# Patient Record
Sex: Female | Born: 1970 | Race: Black or African American | Hispanic: No | Marital: Single | Smoking: Never smoker
Health system: Southern US, Community
[De-identification: ages and names within clinical notes are randomized; demographics above are authoritative.]

## PROBLEM LIST (undated history)

## (undated) DIAGNOSIS — Z853 Personal history of malignant neoplasm of breast: Secondary | ICD-10-CM

## (undated) DIAGNOSIS — I890 Lymphedema, not elsewhere classified: Secondary | ICD-10-CM

## (undated) DIAGNOSIS — L282 Other prurigo: Secondary | ICD-10-CM

## (undated) DIAGNOSIS — K219 Gastro-esophageal reflux disease without esophagitis: Secondary | ICD-10-CM

## (undated) DIAGNOSIS — N61 Mastitis without abscess: Secondary | ICD-10-CM

## (undated) DIAGNOSIS — I10 Essential (primary) hypertension: Secondary | ICD-10-CM

## (undated) DIAGNOSIS — F419 Anxiety disorder, unspecified: Secondary | ICD-10-CM

## (undated) DIAGNOSIS — Z9149 Other personal history of psychological trauma, not elsewhere classified: Secondary | ICD-10-CM

## (undated) DIAGNOSIS — E119 Type 2 diabetes mellitus without complications: Secondary | ICD-10-CM

## (undated) HISTORY — DX: Other personal history of psychological trauma, not elsewhere classified: Z91.49

## (undated) HISTORY — DX: Mastitis without abscess: N61.0

## (undated) HISTORY — DX: Gastro-esophageal reflux disease without esophagitis: K21.9

## (undated) HISTORY — DX: Type 2 diabetes mellitus without complications: E11.9

## (undated) HISTORY — DX: Personal history of malignant neoplasm of breast: Z85.3

## (undated) HISTORY — DX: Anxiety disorder, unspecified: F41.9

## (undated) HISTORY — DX: Other prurigo: L28.2

## (undated) HISTORY — DX: Lymphedema, not elsewhere classified: I89.0

---

## 2012-02-05 HISTORY — PX: KNEE SURGERY: SHX244

## 2012-02-27 ENCOUNTER — Encounter (HOSPITAL_COMMUNITY): Payer: Self-pay

## 2012-02-27 ENCOUNTER — Emergency Department (HOSPITAL_COMMUNITY)
Admission: EM | Admit: 2012-02-27 | Discharge: 2012-02-27 | Disposition: A | Discharge status: Home / Self Care | Payer: Self-pay | Attending: Emergency Medicine | Admitting: Emergency Medicine

## 2012-02-27 ENCOUNTER — Emergency Department (HOSPITAL_COMMUNITY): Payer: Self-pay

## 2012-02-27 DIAGNOSIS — R0789 Other chest pain: Secondary | ICD-10-CM | POA: Insufficient documentation

## 2012-02-27 DIAGNOSIS — E669 Obesity, unspecified: Secondary | ICD-10-CM | POA: Insufficient documentation

## 2012-02-27 DIAGNOSIS — R509 Fever, unspecified: Secondary | ICD-10-CM | POA: Insufficient documentation

## 2012-02-27 DIAGNOSIS — Z79899 Other long term (current) drug therapy: Secondary | ICD-10-CM | POA: Insufficient documentation

## 2012-02-27 DIAGNOSIS — I10 Essential (primary) hypertension: Secondary | ICD-10-CM | POA: Insufficient documentation

## 2012-02-27 DIAGNOSIS — R0602 Shortness of breath: Secondary | ICD-10-CM | POA: Insufficient documentation

## 2012-02-27 DIAGNOSIS — R51 Headache: Secondary | ICD-10-CM | POA: Insufficient documentation

## 2012-02-27 DIAGNOSIS — Z7982 Long term (current) use of aspirin: Secondary | ICD-10-CM | POA: Insufficient documentation

## 2012-02-27 DIAGNOSIS — R42 Dizziness and giddiness: Secondary | ICD-10-CM | POA: Insufficient documentation

## 2012-02-27 DIAGNOSIS — R110 Nausea: Secondary | ICD-10-CM | POA: Insufficient documentation

## 2012-02-27 HISTORY — DX: Essential (primary) hypertension: I10

## 2012-02-27 LAB — BASIC METABOLIC PANEL
BUN: 11 mg/dL (ref 6–23)
Calcium: 9.7 mg/dL (ref 8.4–10.5)
Creatinine, Ser: 0.84 mg/dL (ref 0.50–1.10)
GFR calc non Af Amer: 85 mL/min — ABNORMAL LOW (ref >90)
Glucose, Bld: 115 mg/dL — ABNORMAL HIGH (ref 70–99)

## 2012-02-27 LAB — CBC
HCT: 40.8 % (ref 36.0–46.0)
Hemoglobin: 14.2 g/dL (ref 12.0–15.0)
MCH: 27.1 pg (ref 26.0–34.0)
MCHC: 34.8 g/dL (ref 30.0–36.0)
MCV: 77.9 fL — ABNORMAL LOW (ref 78.0–100.0)

## 2012-02-27 LAB — D-DIMER, QUANTITATIVE: D-Dimer, Quant: 0.32 ug/mL-FEU (ref 0.00–0.48)

## 2012-02-27 MED ORDER — LORAZEPAM 2 MG/ML IJ SOLN
1.0000 mg | Freq: Once | INTRAMUSCULAR | Status: AC
  Administered 2012-02-27: 1 mg via INTRAVENOUS
  Filled 2012-02-27: qty 1

## 2012-02-27 NOTE — ED Notes (Signed)
Pt anxious, tearful and tachypneic, Jerri EMT at bedside consoling pt. 1mg  ativan given, pt respirations 15

## 2012-02-27 NOTE — ED Notes (Signed)
Patient presents with c/o midchest "tightness", sob, nausea x intermittently throughout the day. Endorses dizziness, headache, subjective fevers and chills. No vomiting or abdominal pain. Patient is extremely anxious at triage.

## 2012-02-27 NOTE — ED Provider Notes (Signed)
History     CSN: [id number]  Arrival date & time 02/27/12  2016   First MD Initiated Contact with Patient 02/27/12 2057      Chief Complaint  Patient presents with  . Chest Pain    (Consider location/radiation/quality/duration/timing/severity/associated sxs/prior treatment) Patient is a 42 y.o. female presenting with chest pain. The history is provided by the patient.  Chest Pain Pain location:  Epigastric Pain quality: pressure   Pain radiates to:  Does not radiate Pain radiates to the back: no   Pain severity:  Mild Onset quality:  Gradual Timing:  Intermittent Progression:  Waxing and waning Chronicity:  New Context: at rest   Relieved by:  Nothing Worsened by:  Nothing tried Ineffective treatments:  None tried Associated symptoms: shortness of breath   Associated symptoms: no abdominal pain, no back pain, no fatigue, no fever, no headache, no nausea, no palpitations, not vomiting and no weakness   Risk factors: hypertension     Past Medical History  Diagnosis Date  . Hypertension     Past Surgical History  Procedure Laterality Date  . Knee surgery Left 02/05/2012    No family history on file.  History  Substance Use Topics  . Smoking status: Never Smoker   . Smokeless tobacco: Never Used  . Alcohol Use: 10.8 oz/week    18 Cans of beer per week     Comment: about 18 cans of beer per week     OB History   Grav Para Term Preterm Abortions TAB SAB Ect Mult Living                  Review of Systems  Constitutional: Negative for fever and fatigue.  HENT: Negative for congestion, rhinorrhea and postnasal drip.   Eyes: Negative for photophobia and visual disturbance.  Respiratory: Positive for shortness of breath. Negative for chest tightness and wheezing.   Cardiovascular: Positive for chest pain. Negative for palpitations and leg swelling.  Gastrointestinal: Negative for nausea, vomiting, abdominal pain and diarrhea.  Genitourinary: Negative for  urgency, frequency and difficulty urinating.  Musculoskeletal: Negative for back pain and arthralgias.  Skin: Negative for rash and wound.  Neurological: Negative for weakness and headaches.  Psychiatric/Behavioral: Negative for confusion and agitation.    Allergies  Review of patient's allergies indicates no known allergies.  Home Medications   Current Outpatient Rx  Name  Route  Sig  Dispense  Refill  . aspirin 81 MG chewable tablet   Oral   Chew 81 mg by mouth daily.         . naproxen (NAPROSYN) 500 MG tablet   Oral   Take 500 mg by mouth 2 (two) times daily with a meal.         . oxyCODONE (OXY IR/ROXICODONE) 5 MG immediate release tablet   Oral   Take 5 mg by mouth every 4 (four) hours as needed for pain. For pain         . PRESCRIPTION MEDICATION   Oral   Take 1 tablet by mouth daily. 2 blood pressure medication         . traZODone (DESYREL) 150 MG tablet   Oral   Take 150 mg by mouth at bedtime.           BP 128/88  Pulse 85  Temp(Src) 97.8 F (36.6 C) (Oral)  Resp 20  SpO2 96%  LMP 02/08/2012  Physical Exam  Nursing note and vitals reviewed. Constitutional: She is oriented to  person, place, and time. She appears well-developed and well-nourished. No distress.  HENT:  Head: Normocephalic and atraumatic.  Mouth/Throat: Oropharynx is clear and moist.  Eyes: EOM are normal. Pupils are equal, round, and reactive to light.  Neck: Normal range of motion. Neck supple.  Cardiovascular: Normal rate, regular rhythm, normal heart sounds and intact distal pulses.   Pulmonary/Chest: Effort normal and breath sounds normal. She has no wheezes. She has no rales.  Abdominal: Soft. Bowel sounds are normal. She exhibits no distension. There is no tenderness. There is no rebound and no guarding.  Musculoskeletal: Normal range of motion. She exhibits no edema and no tenderness.  Lymphadenopathy:    She has no cervical adenopathy.  Neurological: She is alert and  oriented to person, place, and time. She displays normal reflexes. No cranial nerve deficit. She exhibits normal muscle tone. Coordination normal.  Skin: Skin is warm and dry. No rash noted.  Psychiatric: She has a normal mood and affect. Her behavior is normal.    ED Course  Procedures (including critical care time)   Date: 02/27/2012  Rate: 103  Rhythm: sinus tachycardia  QRS Axis: normal  Intervals: normal  ST/T Wave abnormalities: normal  Conduction Disutrbances:none  Narrative Interpretation:   Old EKG Reviewed: none available    Labs Reviewed  CBC - Abnormal; Notable for the following:    RBC 5.24 (*)    MCV 77.9 (*)    All other components within normal limits  BASIC METABOLIC PANEL - Abnormal; Notable for the following:    Potassium 3.2 (*)    Glucose, Bld 115 (*)    GFR calc non Af Amer 85 (*)    All other components within normal limits  D-DIMER, QUANTITATIVE  POCT I-STAT TROPONIN I   Dg Chest 2 View  02/27/2012  *RADIOLOGY REPORT*  Clinical Data: Chest pain, pressure, shortness of breath.  Recent knee surgery.  Chest pain and hypertension.  CHEST - 2 VIEW  Comparison: 07/29/2006  Findings: Slight elevation of the left hemidiaphragm. The heart size and pulmonary vascularity are normal. The lungs appear clear and expanded without focal air space disease or consolidation. No blunting of the costophrenic angles.  No pneumothorax.  Mediastinal contours appear intact.  No significant change since previous study.  IMPRESSION: No evidence of active pulmonary disease.   Original Report Authenticated By: Burman Nieves, M.D.      1. Chest pain, atypical       MDM  17F with ho HTN and recent left knee surgery here with chest pain and dyspnea. Chest has been intermittent for the last month. Has been scheduled for a stress test on next week. Shortness of breath is new. Pain is tightness and non-radiating. She also has associated nausea, chills, and tremors. Exam as noted  above. She appears very anxious and teary-eyed. Hands are trembling. Chest non-tender. Lungs clear. Left knee in brace but no unilateral leg swelling. Not tachycardic or hypoxic. Initial EKG without ischemic changes. CXR without acute cardiopulmonary disease. Only risk factor for ACS is HTN and obesity. Initial troponin negative. Will also obtain ddimer as PE is on differential  Ddimer negative. Pt started becoming very anxious, hyperventilating, tingling in fingertips. Felt to be a panic attack. Given 1 mg Ativan with complete resolution of symptoms including shortness of breath and chest pain. Felt ACS, PE, dissection, pericarditis to be unlikely. Pt is stable for d/c home. She will follow up with pcp next week for stress test. Return precautions given.  Johnnette Gourd, MD 02/28/12 0110

## 2012-02-28 NOTE — ED Provider Notes (Signed)
I saw and evaluated the patient, reviewed the resident's note and I agree with the findings and plan. I have reviewed EKG and agree with the resident interpretation.  Patient coming in with the complaint of pleuritic type chest pain that's been intermittent today with intermittent shortness of breath. On exam patient is very anxious and is hyperventilating. She states she does occasionally suffer from anxiety but has never had a panic attack. She is a low risk well as criteria and has no cardiac risk factors. She is actually scheduled to see a cardiologist this week for upcoming test. Labs here are unrevealing. Chest x-ray within normal limits. After Ativan patient symptoms are completely resolved   Janice Sprout, MD 02/28/12 1913

## 2013-05-16 IMAGING — CR DG CHEST 2V
2 series · 2 of 2 positions shown · non-contrast
Comparison: 07/29/2006

CLINICAL DATA: Chest pain, pressure, shortness of breath.  Recent
knee surgery.  Chest pain and hypertension.

CHEST - 2 VIEW

[w chest pa]
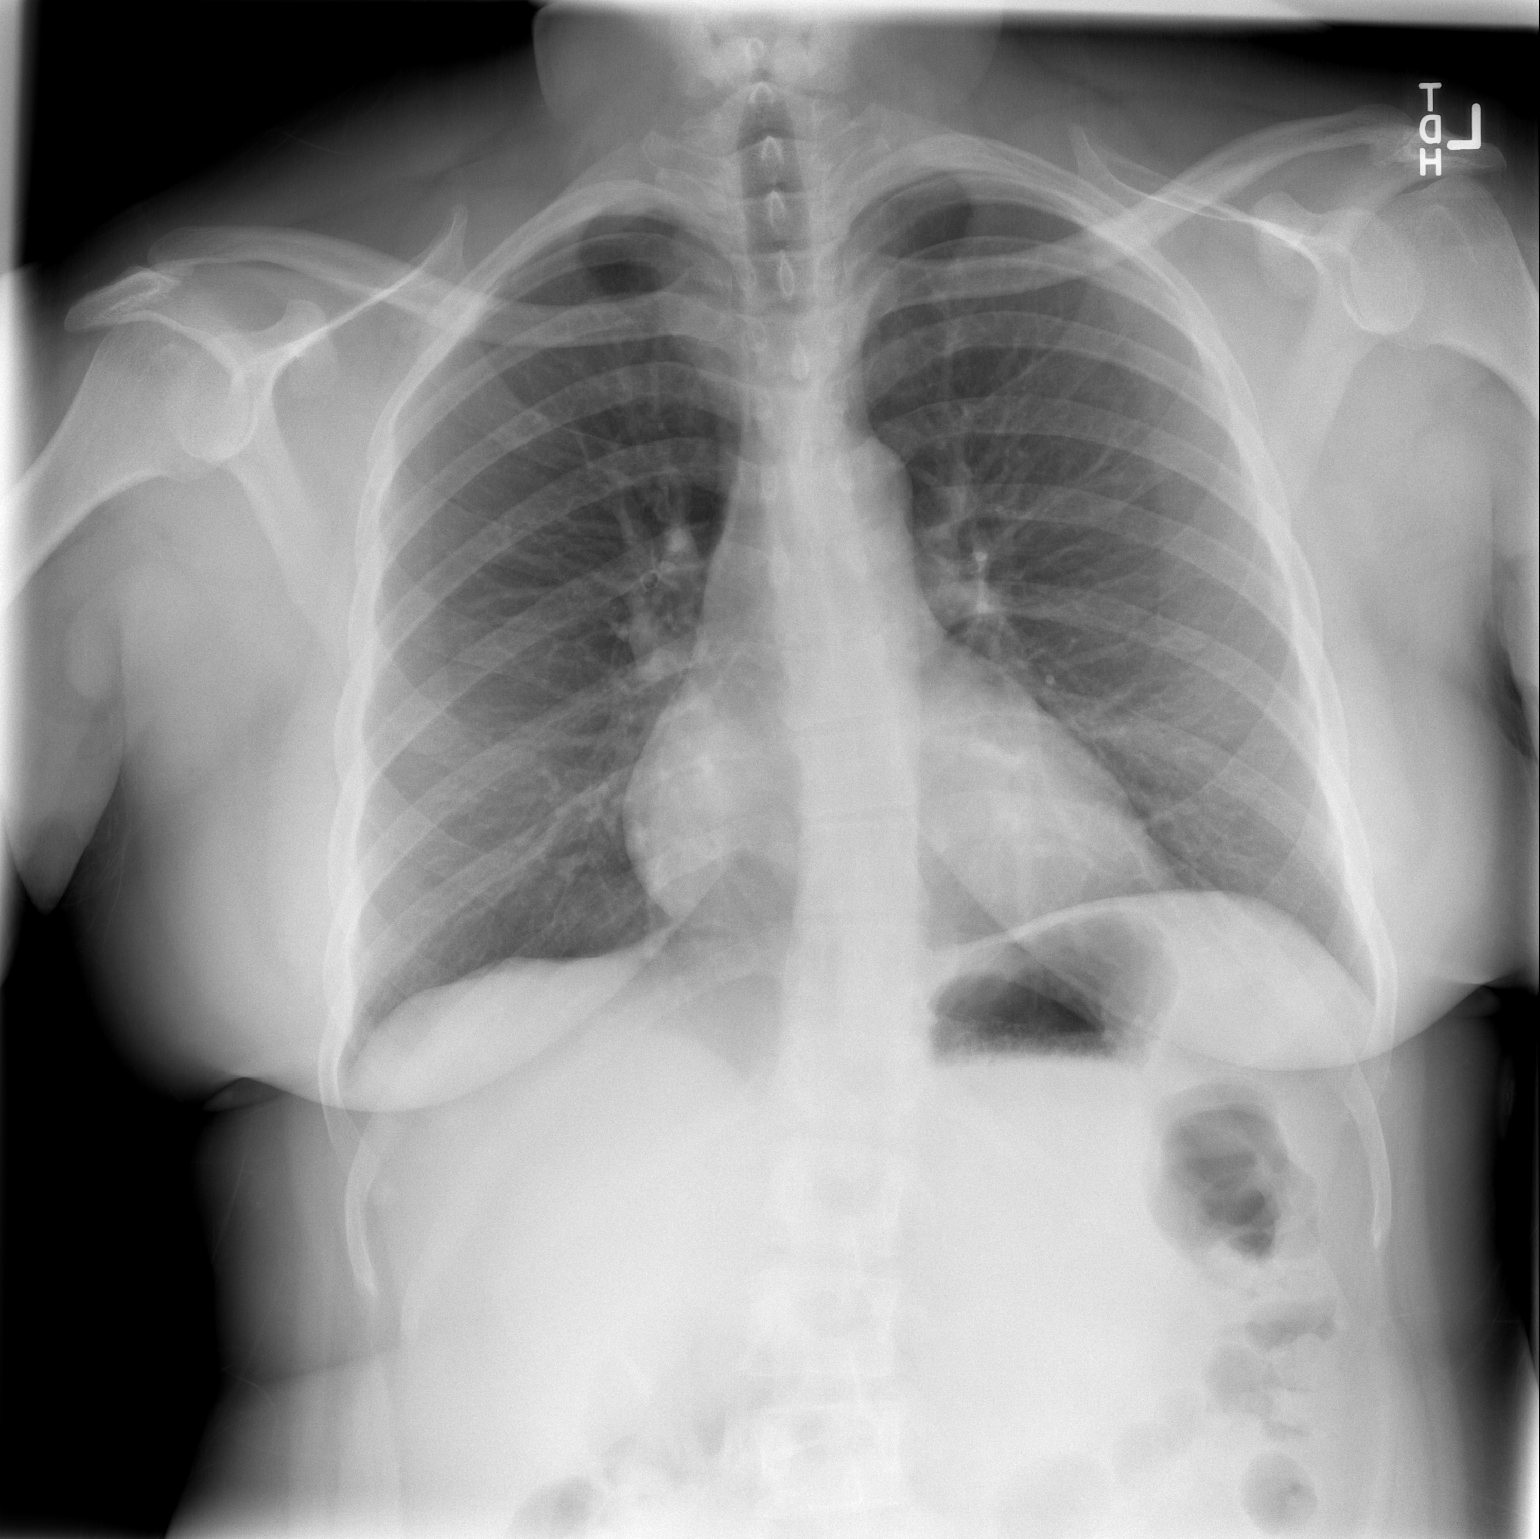

[w chest lat]
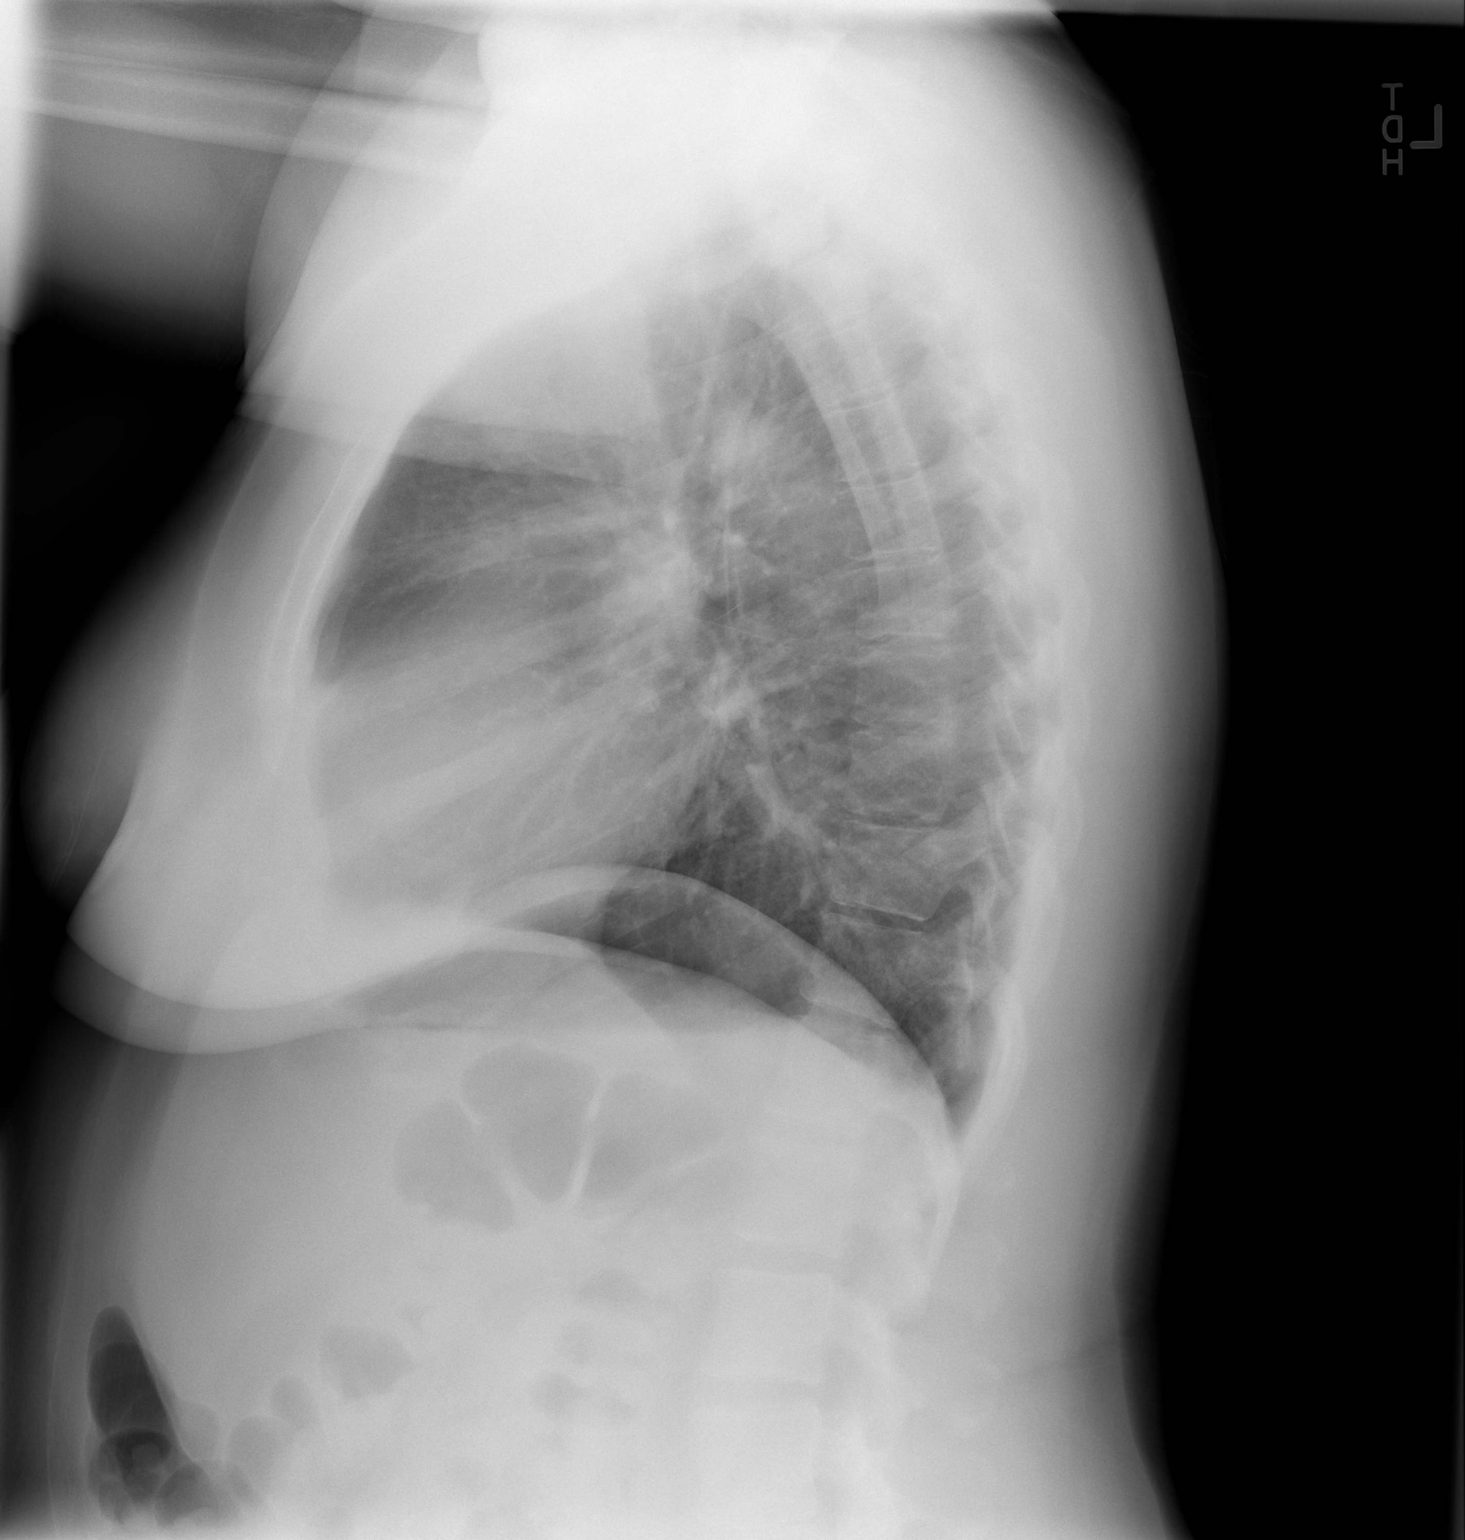

[2 of 2 positions shown; findings below may reference images not displayed]

FINDINGS: Slight elevation of the left hemidiaphragm. The heart
size and pulmonary vascularity are normal. The lungs appear clear
and expanded without focal air space disease or consolidation. No
blunting of the costophrenic angles.  No pneumothorax.  Mediastinal
contours appear intact.  No significant change since previous
study.
IMPRESSION: No evidence of active pulmonary disease.

## 2020-01-20 DIAGNOSIS — C801 Malignant (primary) neoplasm, unspecified: Secondary | ICD-10-CM

## 2020-01-20 HISTORY — DX: Malignant (primary) neoplasm, unspecified: C80.1

## 2021-01-19 HISTORY — PX: ROTATOR CUFF REPAIR: SHX139

## 2021-04-24 ENCOUNTER — Other Ambulatory Visit: Payer: Self-pay | Admitting: Physician Assistant

## 2021-04-24 DIAGNOSIS — N6320 Unspecified lump in the left breast, unspecified quadrant: Secondary | ICD-10-CM

## 2021-05-12 ENCOUNTER — Other Ambulatory Visit: Payer: Self-pay

## 2021-09-19 ENCOUNTER — Telehealth: Payer: Self-pay

## 2021-09-19 NOTE — Progress Notes (Deleted)
     Referring-Carolyn Stokes, PA Reason for referral-hyperlipidemia and hypertension  HPI: 51 year old female for evaluation of hypertension and hyperlipidemia at request of Lewanda Rife, [place].  Current Outpatient Medications  Medication Sig Dispense Refill   aspirin 81 MG chewable tablet Chew 81 mg by mouth daily.     naproxen (NAPROSYN) 500 MG tablet Take 500 mg by mouth 2 (two) times daily with a meal.     oxyCODONE (OXY IR/ROXICODONE) 5 MG immediate release tablet Take 5 mg by mouth every 4 (four) hours as needed for pain. For pain     PRESCRIPTION MEDICATION Take 1 tablet by mouth daily. 2 blood pressure medication     traZODone (DESYREL) 150 MG tablet Take 150 mg by mouth at bedtime.     No current facility-administered medications for this visit.    No Known Allergies   Past Medical History:  Diagnosis Date   Hypertension     Past Surgical History:  Procedure Laterality Date   KNEE SURGERY Left 02/05/2012    Social History   Socioeconomic History   Marital status: Single    Spouse name: Not on file   Number of children: Not on file   Years of education: Not on file   Highest education level: Not on file  Occupational History   Not on file  Tobacco Use   Smoking status: Never   Smokeless tobacco: Never  Substance and Sexual Activity   Alcohol use: Yes    Alcohol/week: 18.0 standard drinks of alcohol    Types: 18 Cans of beer per week    Comment: about 18 cans of beer per week    Drug use: No   Sexual activity: Not on file  Other Topics Concern   Not on file  Social History Narrative   Not on file   Social Determinants of Health   Financial Resource Strain: Not on file  Food Insecurity: Not on file  Transportation Needs: Not on file  Physical Activity: Not on file  Stress: Not on file  Social Connections: Not on file  Intimate Partner Violence: Not on file    No family history on file.  ROS: no fevers or chills, productive cough, hemoptysis,  dysphasia, odynophagia, melena, hematochezia, dysuria, hematuria, rash, seizure activity, orthopnea, PND, pedal edema, claudication. Remaining systems are negative.  Physical Exam:   There were no vitals taken for this visit.  General:  Well developed/well nourished in NAD Skin warm/dry Patient not depressed No peripheral clubbing Back-normal HEENT-normal/normal eyelids Neck supple/normal carotid upstroke bilaterally; no bruits; no JVD; no thyromegaly chest - CTA/ normal expansion CV - RRR/normal S1 and S2; no murmurs, rubs or gallops;  PMI nondisplaced Abdomen -NT/ND, no HSM, no mass, + bowel sounds, no bruit 2+ femoral pulses, no bruits Ext-no edema, chords, 2+ DP Neuro-grossly nonfocal  ECG - personally reviewed  A/P  1 hypertension-  2 hyperlipidemia-  3 diabetes mellitus-  Olga Millers, MD

## 2021-09-19 NOTE — Telephone Encounter (Signed)
NOTES SCANNED TO REFERRAL 

## 2021-09-23 ENCOUNTER — Ambulatory Visit: Payer: Medicaid Other | Attending: Cardiology | Admitting: Cardiology

## 2021-10-31 ENCOUNTER — Ambulatory Visit: Payer: Medicaid Other | Admitting: Interventional Cardiology

## 2022-05-18 ENCOUNTER — Ambulatory Visit (HOSPITAL_COMMUNITY)
Admission: EM | Admit: 2022-05-18 | Discharge: 2022-05-19 | Disposition: A | Discharge status: Other Acute Inpatient Hospital | Payer: Medicaid Other | Attending: Family Medicine | Admitting: Family Medicine

## 2022-05-18 DIAGNOSIS — E876 Hypokalemia: Secondary | ICD-10-CM | POA: Insufficient documentation

## 2022-05-18 DIAGNOSIS — F332 Major depressive disorder, recurrent severe without psychotic features: Secondary | ICD-10-CM | POA: Diagnosis not present

## 2022-05-18 DIAGNOSIS — I10 Essential (primary) hypertension: Secondary | ICD-10-CM

## 2022-05-18 DIAGNOSIS — E119 Type 2 diabetes mellitus without complications: Secondary | ICD-10-CM

## 2022-05-18 DIAGNOSIS — Z794 Long term (current) use of insulin: Secondary | ICD-10-CM | POA: Insufficient documentation

## 2022-05-18 DIAGNOSIS — Z7984 Long term (current) use of oral hypoglycemic drugs: Secondary | ICD-10-CM | POA: Insufficient documentation

## 2022-05-18 DIAGNOSIS — F411 Generalized anxiety disorder: Secondary | ICD-10-CM | POA: Insufficient documentation

## 2022-05-18 DIAGNOSIS — E785 Hyperlipidemia, unspecified: Secondary | ICD-10-CM

## 2022-05-18 DIAGNOSIS — R45851 Suicidal ideations: Secondary | ICD-10-CM

## 2022-05-18 LAB — POCT URINE DRUG SCREEN - MANUAL ENTRY (I-SCREEN)
POC Amphetamine UR: NOT DETECTED
POC Buprenorphine (BUP): NOT DETECTED
POC Cocaine UR: NOT DETECTED
POC Marijuana UR: POSITIVE — AB
POC Methadone UR: NOT DETECTED
POC Methamphetamine UR: NOT DETECTED
POC Morphine: NOT DETECTED
POC Oxazepam (BZO): NOT DETECTED
POC Oxycodone UR: NOT DETECTED
POC Secobarbital (BAR): NOT DETECTED

## 2022-05-18 LAB — HEMOGLOBIN A1C
Hgb A1c MFr Bld: 7.1 % — ABNORMAL HIGH (ref 4.8–5.6)
Mean Plasma Glucose: 157.07 mg/dL

## 2022-05-18 LAB — COMPREHENSIVE METABOLIC PANEL
ALT: 22 U/L (ref 0–44)
AST: 22 U/L (ref 15–41)
Albumin: 3.8 g/dL (ref 3.5–5.0)
Alkaline Phosphatase: 81 U/L (ref 38–126)
Anion gap: 13 (ref 5–15)
BUN: 12 mg/dL (ref 6–20)
CO2: 29 mmol/L (ref 22–32)
Calcium: 9.6 mg/dL (ref 8.9–10.3)
Chloride: 93 mmol/L — ABNORMAL LOW (ref 98–111)
Creatinine, Ser: 0.9 mg/dL (ref 0.44–1.00)
GFR, Estimated: >60 mL/min (ref >60)
Glucose, Bld: 137 mg/dL — ABNORMAL HIGH (ref 70–99)
Potassium: 3.1 mmol/L — ABNORMAL LOW (ref 3.5–5.1)
Sodium: 135 mmol/L (ref 135–145)
Total Bilirubin: 0.4 mg/dL (ref 0.3–1.2)
Total Protein: 7.4 g/dL (ref 6.5–8.1)

## 2022-05-18 LAB — CBC WITH DIFFERENTIAL/PLATELET
Abs Immature Granulocytes: 0.05 10*3/uL (ref 0.00–0.07)
Basophils Absolute: 0 10*3/uL (ref 0.0–0.1)
Basophils Relative: 0 %
Eosinophils Absolute: 0.1 10*3/uL (ref 0.0–0.5)
Eosinophils Relative: 1 %
HCT: 42.2 % (ref 36.0–46.0)
Hemoglobin: 13.4 g/dL (ref 12.0–15.0)
Immature Granulocytes: 1 %
Lymphocytes Relative: 22 %
Lymphs Abs: 1.5 10*3/uL (ref 0.7–4.0)
MCH: 25.4 pg — ABNORMAL LOW (ref 26.0–34.0)
MCHC: 31.8 g/dL (ref 30.0–36.0)
MCV: 80.1 fL (ref 80.0–100.0)
Monocytes Absolute: 0.5 10*3/uL (ref 0.1–1.0)
Monocytes Relative: 7 %
Neutro Abs: 4.9 10*3/uL (ref 1.7–7.7)
Neutrophils Relative %: 69 %
Platelets: 290 10*3/uL (ref 150–400)
RBC: 5.27 MIL/uL — ABNORMAL HIGH (ref 3.87–5.11)
RDW: 13.9 % (ref 11.5–15.5)
WBC: 7 10*3/uL (ref 4.0–10.5)
nRBC: 0 % (ref 0.0–0.2)

## 2022-05-18 LAB — LIPID PANEL
Cholesterol: 255 mg/dL — ABNORMAL HIGH (ref 0–200)
HDL: 54 mg/dL (ref >40)
LDL Cholesterol: 170 mg/dL — ABNORMAL HIGH (ref 0–99)
Total CHOL/HDL Ratio: 4.7 RATIO
Triglycerides: 156 mg/dL — ABNORMAL HIGH (ref <150)
VLDL: 31 mg/dL (ref 0–40)

## 2022-05-18 LAB — GLUCOSE, CAPILLARY
Glucose-Capillary: 109 mg/dL — ABNORMAL HIGH (ref 70–99)
Glucose-Capillary: 147 mg/dL — ABNORMAL HIGH (ref 70–99)

## 2022-05-18 LAB — URINALYSIS, ROUTINE W REFLEX MICROSCOPIC
Bilirubin Urine: NEGATIVE
Glucose, UA: NEGATIVE mg/dL
Hgb urine dipstick: NEGATIVE
Ketones, ur: NEGATIVE mg/dL
Leukocytes,Ua: NEGATIVE
Nitrite: NEGATIVE
Protein, ur: NEGATIVE mg/dL
Specific Gravity, Urine: 1.006 (ref 1.005–1.030)
pH: 7 (ref 5.0–8.0)

## 2022-05-18 LAB — ETHANOL: Alcohol, Ethyl (B): <10 mg/dL (ref <10)

## 2022-05-18 LAB — HCG, QUANTITATIVE, PREGNANCY: hCG, Beta Chain, Quant, S: <1 m[IU]/mL (ref <5)

## 2022-05-18 LAB — MAGNESIUM: Magnesium: 2 mg/dL (ref 1.7–2.4)

## 2022-05-18 LAB — TSH: TSH: 0.503 u[IU]/mL (ref 0.350–4.500)

## 2022-05-18 LAB — POTASSIUM: Potassium: 3.2 mmol/L — ABNORMAL LOW (ref 3.5–5.1)

## 2022-05-18 MED ORDER — TAMOXIFEN CITRATE 10 MG PO TABS
20.0000 mg | ORAL_TABLET | Freq: Every day | ORAL | Status: DC
  Administered 2022-05-18 – 2022-05-19 (×2): 20 mg via ORAL
  Filled 2022-05-18 (×4): qty 2

## 2022-05-18 MED ORDER — VENLAFAXINE HCL ER 150 MG PO CP24: 150.0000 mg | ORAL_CAPSULE | Freq: Every day | ORAL | Status: DC

## 2022-05-18 MED ORDER — TRAZODONE HCL 150 MG PO TABS
150.0000 mg | ORAL_TABLET | Freq: Every day | ORAL | Status: DC
  Administered 2022-05-18: 150 mg via ORAL
  Filled 2022-05-18: qty 1

## 2022-05-18 MED ORDER — LORATADINE 10 MG PO TABS
10.0000 mg | ORAL_TABLET | Freq: Every day | ORAL | Status: DC
  Administered 2022-05-18 – 2022-05-19 (×2): 10 mg via ORAL
  Filled 2022-05-18 (×2): qty 1

## 2022-05-18 MED ORDER — LORAZEPAM 1 MG PO TABS
2.0000 mg | ORAL_TABLET | Freq: Once | ORAL | Status: AC
  Administered 2022-05-18: 2 mg via ORAL
  Filled 2022-05-18: qty 2

## 2022-05-18 MED ORDER — VENLAFAXINE HCL ER 75 MG PO CP24
225.0000 mg | ORAL_CAPSULE | Freq: Every day | ORAL | Status: DC
  Administered 2022-05-19: 225 mg via ORAL
  Filled 2022-05-18: qty 6

## 2022-05-18 MED ORDER — LORAZEPAM 1 MG PO TABS: 1.0000 mg | ORAL_TABLET | Freq: Three times a day (TID) | ORAL | Status: DC | PRN

## 2022-05-18 MED ORDER — NAPROXEN 500 MG PO TABS: 500.0000 mg | ORAL_TABLET | Freq: Two times a day (BID) | ORAL | Status: DC | PRN

## 2022-05-18 MED ORDER — ALUM & MAG HYDROXIDE-SIMETH 200-200-20 MG/5ML PO SUSP: 30.0000 mL | ORAL | Status: DC | PRN

## 2022-05-18 MED ORDER — HYDROCHLOROTHIAZIDE 25 MG PO TABS
25.0000 mg | ORAL_TABLET | Freq: Once | ORAL | Status: AC
  Administered 2022-05-18: 25 mg via ORAL
  Filled 2022-05-18: qty 1

## 2022-05-18 MED ORDER — MAGNESIUM HYDROXIDE 400 MG/5ML PO SUSP: 30.0000 mL | Freq: Every day | ORAL | Status: DC | PRN

## 2022-05-18 MED ORDER — HYDROXYZINE HCL 25 MG PO TABS: 25.0000 mg | ORAL_TABLET | Freq: Three times a day (TID) | ORAL | Status: DC | PRN

## 2022-05-18 MED ORDER — ACETAMINOPHEN 325 MG PO TABS: 650.0000 mg | ORAL_TABLET | Freq: Four times a day (QID) | ORAL | Status: DC | PRN

## 2022-05-18 MED ORDER — ROSUVASTATIN CALCIUM 5 MG PO TABS
10.0000 mg | ORAL_TABLET | Freq: Every day | ORAL | Status: DC
  Administered 2022-05-18 – 2022-05-19 (×2): 10 mg via ORAL
  Filled 2022-05-18 (×2): qty 2

## 2022-05-18 MED ORDER — POTASSIUM CHLORIDE CRYS ER 20 MEQ PO TBCR
40.0000 meq | EXTENDED_RELEASE_TABLET | Freq: Once | ORAL | Status: AC
  Administered 2022-05-18: 40 meq via ORAL
  Filled 2022-05-18: qty 2

## 2022-05-18 MED ORDER — POTASSIUM CHLORIDE CRYS ER 20 MEQ PO TBCR: 20.0000 meq | EXTENDED_RELEASE_TABLET | Freq: Every day | ORAL | Status: DC

## 2022-05-18 MED ORDER — INSULIN ASPART 100 UNIT/ML IJ SOLN
0.0000 [IU] | Freq: Two times a day (BID) | INTRAMUSCULAR | Status: DC
  Administered 2022-05-19: 1 [IU] via SUBCUTANEOUS

## 2022-05-18 MED ORDER — METFORMIN HCL 850 MG PO TABS
850.0000 mg | ORAL_TABLET | Freq: Every day | ORAL | Status: DC
  Administered 2022-05-19: 850 mg via ORAL
  Filled 2022-05-18: qty 1

## 2022-05-18 NOTE — Progress Notes (Signed)
Pt was accepted to [facility] TODAY  05/18/2022; Bed Assignment PENDING Repeat K+  Pt meets inpatient criteria per Joaquin Courts, FNP  Attending Physician will be Dr. Phineas Inches, MD  Report can be called to: -Adult unit: [id number]  Pt can arrive after: Night [facility] Edythe Clarity, RN will communicate with care team upon completion of PENDING items.  Care Team notified: Day [facility] Rona Ravens, RN, Night [facility] Edythe Clarity, RN, Erskine Emery, NP, Harless Litten, RN, Joaquin Courts, NP, Adrian Prows, RN, [address], LCSWA   [place], [place] 05/18/2022 @ 6:08 PM

## 2022-05-18 NOTE — ED Notes (Addendum)
Patient is sitting quietly in bed, no distress noted, will continue to monitor patient for safety.

## 2022-05-18 NOTE — ED Notes (Signed)
Unable to run urine pregnancy on patient as we are out of urine pregnancy tests. Provider was notified HCG lab was ordered instead

## 2022-05-18 NOTE — ED Notes (Signed)
Patient has been brought on unit and familiarized with unit, food has been offered.

## 2022-05-18 NOTE — ED Notes (Signed)
Pt sleeping@this time. Breathing even and unlabored 
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
## 2022-05-18 NOTE — Progress Notes (Signed)
   05/18/22 0855  [facility] Triage Screening (Walk-ins at [facility] only)  How Did You Hear About [place]? Self  What Is the Reason for Your Visit/Call Today? Pt presents to [facility] voluntarily due to worsening depression symptoms. Pt reports multiple stressors including being diagnosed with breast cancer, having rotator cuff surgery, dealing with her son who is schizophrenic, and lack of support. Pt is extremely tearful during triage. Pt report she went to jail 3 weeks ago and attempted to hang herself while in jail. Pt reports she thought about overdosing on medication 1 week ago and tried to contact her provider to help but could not get an appointment, pt reports she still has this plan but decided to come to this facility for help. Pt states "I can't function", "I can't get myself together". Pt denies HI and AVH at this time.  How Long Has This Been Causing You Problems? 1 wk - 1 month  Have You Recently Had Any Thoughts About Hurting Yourself? Yes  How long ago did you have thoughts about hurting yourself? today  Are You Planning to Commit Suicide/Harm Yourself At This time? Yes  Have you Recently Had Thoughts About Hurting Someone Karolee Ohs? No  Are You Planning To Harm Someone At This Time? No  Are you currently experiencing any auditory, visual or other hallucinations? No  Have You Used Any Alcohol or Drugs in the Past 24 Hours? Yes  How long ago did you use Drugs or Alcohol? daily  What Did You Use and How Much? marijuana, unknown amount  Do you have any current medical co-morbidities that require immediate attention? No  Clinician description of patient physical appearance/behavior: depressed mood congruent affect, tearful, casually dressed  What Do You Feel Would Help You the Most Today? Treatment for Depression or other mood problem  If access to [facility] Urgent Care was not available, would you have sought care in the Emergency Department? No  Determination of Need Emergent (2 hours)  Options For Referral  Outpatient Therapy;Medication Management;Inpatient Hospitalization

## 2022-05-18 NOTE — ED Notes (Signed)
[facility] AC did not want to take pt due to potassium being 3.2 [facility] Marie aware and ROY NP made aware. Will inform day shift

## 2022-05-18 NOTE — ED Notes (Signed)
Pt sleeping@this  time. Breathing even and unlabored. No /co pian or distress. Will continue pt monitor for ing for safety

## 2022-05-18 NOTE — Progress Notes (Signed)
Pt was accepted to [facility] TODAY 05/18/2022, pending redraw of K+ and signed voluntary consent faxed to [phone]. Bed assignment: 306-2  Pt meets inpatient criteria per Joaquin Courts, NP  Attending Physician will be Phineas Inches, MD  Report can be called to: - Adult unit: [id number]  Pt can arrive after 2100  Care Team Notified: [facility] Rona Ravens, RN, Joaquin Courts, NP, and Adrian Prows, RN  [place], [place]  05/18/2022 2:47 PM

## 2022-05-18 NOTE — ED Notes (Signed)
Pt is currently sleeping, no distress noted, environmental check complete, will continue to monitor patient for safety.  
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
## 2022-05-18 NOTE — ED Notes (Addendum)
Patients car is parked over at DSS, patient will be transferred over [facility] tonight. Patient stated she has called family/friends to come pick up her car but they are not able too. Patient asked if staff could move her car over to the [facility] parking lot so it would not be towed. According to [employer] security and [facility] security they are not allowed too. Per patients request clinical staff moved her car and parked it over at the [facility] parking lot and her keys were placed back into her purse in her locker.

## 2022-05-19 ENCOUNTER — Other Ambulatory Visit: Payer: Self-pay

## 2022-05-19 ENCOUNTER — Inpatient Hospital Stay (HOSPITAL_COMMUNITY)
Admission: AD | Admit: 2022-05-19 | Discharge: 2022-05-26 | DRG: 885 | Disposition: A | Discharge status: Home / Self Care | Payer: Medicaid Other | Source: Intra-hospital | Attending: Psychiatry | Admitting: Psychiatry

## 2022-05-19 ENCOUNTER — Encounter (HOSPITAL_COMMUNITY): Payer: Self-pay | Admitting: Family Medicine

## 2022-05-19 DIAGNOSIS — Z7984 Long term (current) use of oral hypoglycemic drugs: Secondary | ICD-10-CM

## 2022-05-19 DIAGNOSIS — F332 Major depressive disorder, recurrent severe without psychotic features: Principal | ICD-10-CM | POA: Diagnosis present

## 2022-05-19 DIAGNOSIS — Z853 Personal history of malignant neoplasm of breast: Secondary | ICD-10-CM | POA: Diagnosis not present

## 2022-05-19 DIAGNOSIS — Z818 Family history of other mental and behavioral disorders: Secondary | ICD-10-CM

## 2022-05-19 DIAGNOSIS — F411 Generalized anxiety disorder: Secondary | ICD-10-CM | POA: Diagnosis present

## 2022-05-19 DIAGNOSIS — R12 Heartburn: Secondary | ICD-10-CM | POA: Diagnosis present

## 2022-05-19 DIAGNOSIS — E119 Type 2 diabetes mellitus without complications: Secondary | ICD-10-CM | POA: Diagnosis present

## 2022-05-19 DIAGNOSIS — I10 Essential (primary) hypertension: Secondary | ICD-10-CM | POA: Diagnosis present

## 2022-05-19 DIAGNOSIS — R45851 Suicidal ideations: Secondary | ICD-10-CM | POA: Diagnosis present

## 2022-05-19 LAB — GLUCOSE, CAPILLARY
Glucose-Capillary: 160 mg/dL — ABNORMAL HIGH (ref 70–99)
Glucose-Capillary: 113 mg/dL — ABNORMAL HIGH (ref 70–99)
Glucose-Capillary: 118 mg/dL — ABNORMAL HIGH (ref 70–99)
Glucose-Capillary: 143 mg/dL — ABNORMAL HIGH (ref 70–99)

## 2022-05-19 MED ORDER — VENLAFAXINE HCL ER 75 MG PO CP24: 225.0000 mg | ORAL_CAPSULE | Freq: Every day | ORAL | Status: AC

## 2022-05-19 MED ORDER — LORAZEPAM 2 MG/ML IJ SOLN: 2.0000 mg | Freq: Three times a day (TID) | INTRAMUSCULAR | Status: DC | PRN

## 2022-05-19 MED ORDER — LORATADINE 10 MG PO TABS
10.0000 mg | ORAL_TABLET | Freq: Every day | ORAL | Status: DC
  Administered 2022-05-20 – 2022-05-26 (×7): 10 mg via ORAL
  Filled 2022-05-19 (×9): qty 1

## 2022-05-19 MED ORDER — MAGNESIUM HYDROXIDE 400 MG/5ML PO SUSP
30.0000 mL | Freq: Every day | ORAL | Status: DC | PRN
  Administered 2022-05-24: 30 mL via ORAL
  Filled 2022-05-19: qty 30

## 2022-05-19 MED ORDER — ALUM & MAG HYDROXIDE-SIMETH 200-200-20 MG/5ML PO SUSP
30.0000 mL | ORAL | Status: DC | PRN
Start: 2022-05-19 — End: 2022-05-19

## 2022-05-19 MED ORDER — TAMOXIFEN CITRATE 10 MG PO TABS
20.0000 mg | ORAL_TABLET | Freq: Every day | ORAL | Status: DC
  Administered 2022-05-20 – 2022-05-26 (×7): 20 mg via ORAL
  Filled 2022-05-19 (×10): qty 2

## 2022-05-19 MED ORDER — ALUM & MAG HYDROXIDE-SIMETH 200-200-20 MG/5ML PO SUSP: 30.0000 mL | ORAL | Status: DC | PRN

## 2022-05-19 MED ORDER — VENLAFAXINE HCL ER 75 MG PO CP24
225.0000 mg | ORAL_CAPSULE | Freq: Every day | ORAL | Status: DC
  Administered 2022-05-20 – 2022-05-26 (×7): 225 mg via ORAL
  Filled 2022-05-19 (×10): qty 3

## 2022-05-19 MED ORDER — POTASSIUM CHLORIDE CRYS ER 20 MEQ PO TBCR
40.0000 meq | EXTENDED_RELEASE_TABLET | Freq: Once | ORAL | Status: AC
  Administered 2022-05-19: 40 meq via ORAL
  Filled 2022-05-19 (×2): qty 2

## 2022-05-19 MED ORDER — ROSUVASTATIN CALCIUM 10 MG PO TABS
10.0000 mg | ORAL_TABLET | Freq: Every day | ORAL | Status: DC
  Administered 2022-05-20 – 2022-05-26 (×7): 10 mg via ORAL
  Filled 2022-05-19 (×6): qty 1
  Filled 2022-05-19: qty 2
  Filled 2022-05-19 (×3): qty 1
  Filled 2022-05-19: qty 2

## 2022-05-19 MED ORDER — HYDROCHLOROTHIAZIDE 25 MG PO TABS
25.0000 mg | ORAL_TABLET | Freq: Every day | ORAL | Status: DC
  Administered 2022-05-19 – 2022-05-26 (×8): 25 mg via ORAL
  Filled 2022-05-19 (×12): qty 1

## 2022-05-19 MED ORDER — MAGNESIUM HYDROXIDE 400 MG/5ML PO SUSP
30.0000 mL | Freq: Every day | ORAL | Status: DC | PRN
Start: 2022-05-19 — End: 2022-05-19

## 2022-05-19 MED ORDER — TRAZODONE HCL 150 MG PO TABS
150.0000 mg | ORAL_TABLET | Freq: Every day | ORAL | Status: DC
  Administered 2022-05-19 – 2022-05-22 (×4): 150 mg via ORAL
  Filled 2022-05-19 (×8): qty 1

## 2022-05-19 MED ORDER — HYDROXYZINE HCL 25 MG PO TABS
25.0000 mg | ORAL_TABLET | Freq: Three times a day (TID) | ORAL | Status: DC | PRN
  Administered 2022-05-20 – 2022-05-25 (×4): 25 mg via ORAL
  Filled 2022-05-19 (×6): qty 1

## 2022-05-19 MED ORDER — DIPHENHYDRAMINE HCL 25 MG PO CAPS: 50.0000 mg | ORAL_CAPSULE | Freq: Three times a day (TID) | ORAL | Status: DC | PRN

## 2022-05-19 MED ORDER — METFORMIN HCL 850 MG PO TABS: 850.0000 mg | ORAL_TABLET | Freq: Every day | ORAL | Status: AC

## 2022-05-19 MED ORDER — ACETAMINOPHEN 325 MG PO TABS: 650.0000 mg | ORAL_TABLET | Freq: Four times a day (QID) | ORAL | Status: DC | PRN

## 2022-05-19 MED ORDER — CLONIDINE HCL 0.1 MG PO TABS
0.1000 mg | ORAL_TABLET | ORAL | Status: AC
  Administered 2022-05-19: 0.1 mg via ORAL
  Filled 2022-05-19 (×2): qty 1

## 2022-05-19 MED ORDER — POTASSIUM CHLORIDE CRYS ER 20 MEQ PO TBCR
20.0000 meq | EXTENDED_RELEASE_TABLET | Freq: Every day | ORAL | Status: DC
  Administered 2022-05-19 – 2022-05-26 (×8): 20 meq via ORAL
  Filled 2022-05-19 (×13): qty 1

## 2022-05-19 MED ORDER — LORAZEPAM 1 MG PO TABS
1.0000 mg | ORAL_TABLET | Freq: Three times a day (TID) | ORAL | Status: DC | PRN
  Administered 2022-05-19: 1 mg via ORAL
  Filled 2022-05-19: qty 1

## 2022-05-19 MED ORDER — METFORMIN HCL 850 MG PO TABS
850.0000 mg | ORAL_TABLET | Freq: Every day | ORAL | Status: DC
  Administered 2022-05-20 – 2022-05-26 (×7): 850 mg via ORAL
  Filled 2022-05-19 (×9): qty 1

## 2022-05-19 MED ORDER — LORAZEPAM 1 MG PO TABS: 2.0000 mg | ORAL_TABLET | Freq: Three times a day (TID) | ORAL | Status: DC | PRN

## 2022-05-19 MED ORDER — DIPHENHYDRAMINE HCL 50 MG/ML IJ SOLN: 50.0000 mg | Freq: Three times a day (TID) | INTRAMUSCULAR | Status: DC | PRN

## 2022-05-19 MED ORDER — INSULIN ASPART 100 UNIT/ML IJ SOLN: 0.0000 [IU] | Freq: Two times a day (BID) | INTRAMUSCULAR | Status: DC

## 2022-05-19 MED ORDER — NAPROXEN 500 MG PO TABS: 500.0000 mg | ORAL_TABLET | Freq: Two times a day (BID) | ORAL | Status: DC | PRN

## 2022-05-19 MED ORDER — ACETAMINOPHEN 325 MG PO TABS
650.0000 mg | ORAL_TABLET | Freq: Four times a day (QID) | ORAL | Status: DC | PRN
Start: 2022-05-19 — End: 2022-05-19

## 2022-05-19 NOTE — ED Notes (Signed)
Safe transport was called. 

## 2022-05-19 NOTE — Group Note (Signed)
Date:  05/19/2022 Time:  9:35 PM  Group Topic/Focus:  Wrap-Up Group:   The focus of this group is to help patients review their daily goal of treatment and discuss progress on daily workbooks.    Participation Level:  Active  Participation Quality:  Appropriate  Affect:  Appropriate  Cognitive:  Appropriate  Insight: Appropriate  Engagement in Group:  Engaged  Modes of Intervention:  Education and Exploration  Additional Comments:  Patient attended and participated in group tonight. She reports that she came today to [facility]. She is happy that she came.  Janice Vaughan [facility] 05/19/2022, 9:35 PM

## 2022-05-19 NOTE — ED Notes (Signed)
Patient alert and oriented x 3. Denies SI/HI/AVH. Denies intent or plan to harm self or others. Routine conducted according to faculty protocol. Encourage patient to notify staff with any needs or concerns. Patient verbalized agreement and understanding. Will continue to monitor for safety. 

## 2022-05-19 NOTE — ED Notes (Signed)
Patient  sleeping in no acute stress. RR even and unlabored .Environment secured .Will continue to monitor for safely. 

## 2022-05-19 NOTE — ED Notes (Signed)
Notified the provider of the warning that came up when I scanned venlafaxine due to the patient having taken trazodone last night. Provider states that it is ok to still give the venlafaxine

## 2022-05-19 NOTE — ED Notes (Signed)
Patient discharge to [facility] left via safe transported . Escorted by staff.

## 2022-05-19 NOTE — ED Provider Notes (Signed)
FBC/OBS ASAP Discharge Summary  Date and Time: 05/19/2022 9:23 AM  Name: Janice Vaughan  MRN:  [id number]   Discharge Diagnoses:  Final diagnoses:  Severe episode of recurrent major depressive disorder, without psychotic features (HCC)  Passive suicidal ideations  GAD (generalized anxiety disorder)  Essential hypertension  Hyperlipidemia, unspecified hyperlipidemia type  Type 2 diabetes mellitus without complication, without long-term current use of insulin (HCC)  Hypokalemia   HPI:  Janice Vaughan 52 y.o., female patient presented to [facility] as a walk in, voluntarily, unaccompanied here for evaluation of worsening depression and suicidal thoughts.  She was recommended for inpatient psychiatric admission.  She was admitted to the continuous assessment unit while awaiting inpatient bed availability.   Janice Vaughan, 52 y.o., female patient seen face to face by this provider, consulted with Dr. Lucianne Muss; and chart reviewed on 05/19/22.    Of note UDS on admission is positive for marijuana.  EtOH is less than 10  Subjective:   On today's evaluation patient is observed sitting in her bed eating breakfast.  She is alert/oriented x 4, cooperative, and attentive.  She has normal speech and behavior.  She continues to endorse depression with a depressed affect.  She identifies her stressors/triggers as feeling rejected by her daughter, her son has mental illness, and she feels that she has no support.  In addition she has had health concerns that include breast cancer.  She continues to have intermittent thoughts of suicide.  She identifies no specific plan but cannot contract for safety.  She denies HI/AVH.  Objectively she does not appear to be responding to internal/external stimuli.  She does not appear psychotic, manic, or paranoid.  Discussed inpatient psychiatric admission and patient is in agreement.  She remains voluntary.  Stay Summary:   Patient continues to meet the criteria for inpatient  psychiatric admission.  She has been accepted to [facility].  Patient received replacement potassium yesterday and had a potassium level rechecked this a.m.  Her current level is 3.2.  Will add one-time oral dose of potassium 40 mEq.  Total Time spent with patient: 20 minutes  Past Psychiatric History: As documented in age the Past Medical History: As documented in H&P Family History: As noted in H&P Family Psychiatric History: As documented in H&P Social History: As documented in H&P Tobacco Cessation:  Prescription not provided because: Patient is being transferred to [facility] for inpatient admission  Current Medications:  Current Facility-Administered Medications  Medication Dose Route Frequency Provider Last Rate Last Admin   acetaminophen (TYLENOL) tablet 650 mg  650 mg Oral Q6H PRN Bing Neighbors, NP       alum & mag hydroxide-simeth (MAALOX/MYLANTA) 200-200-20 MG/5ML suspension 30 mL  30 mL Oral Q4H PRN Bing Neighbors, NP       hydrOXYzine (ATARAX) tablet 25 mg  25 mg Oral TID PRN Bing Neighbors, NP       insulin aspart (novoLOG) injection 0-6 Units  0-6 Units Subcutaneous BID BM & HS Bing Neighbors, NP       loratadine (CLARITIN) tablet 10 mg  10 mg Oral Daily Bing Neighbors, NP   10 mg at 05/18/22 1524   LORazepam (ATIVAN) tablet 1 mg  1 mg Oral Q8H PRN Bing Neighbors, NP       magnesium hydroxide (MILK OF MAGNESIA) suspension 30 mL  30 mL Oral Daily PRN Bing Neighbors, NP       metFORMIN (GLUCOPHAGE) tablet 850  mg  850 mg Oral Q breakfast Bing Neighbors, NP   850 mg at 05/19/22 1610   naproxen (NAPROSYN) tablet 500 mg  500 mg Oral BID PRN Bing Neighbors, NP       rosuvastatin (CRESTOR) tablet 10 mg  10 mg Oral Daily Bing Neighbors, NP   10 mg at 05/18/22 1525   tamoxifen (NOLVADEX) tablet 20 mg  20 mg Oral Daily Bing Neighbors, NP   20 mg at 05/18/22 1810   traZODone (DESYREL) tablet 150 mg  150 mg Oral QHS Bing Neighbors, NP   150 mg  at 05/18/22 2139   venlafaxine XR (EFFEXOR-XR) 24 hr capsule 225 mg  225 mg Oral Q breakfast Bing Neighbors, NP   225 mg at 05/19/22 9604   Current Outpatient Medications  Medication Sig Dispense Refill   ALPRAZolam (XANAX) 1 MG tablet Take 0.5-1 mg by mouth 2 (two) times daily as needed (For severe anxiety).     amLODipine (NORVASC) 10 MG tablet Take 10 mg by mouth daily.     celecoxib (CELEBREX) 200 MG capsule Take 200 mg by mouth daily as needed for mild pain.     cetirizine (ZYRTEC) 10 MG tablet Take 10 mg by mouth daily.     esomeprazole (NEXIUM) 20 MG capsule Take 20 mg by mouth daily.     hydrochlorothiazide (HYDRODIURIL) 25 MG tablet Take 25 mg by mouth daily.     metFORMIN (GLUCOPHAGE) 850 MG tablet Take 850 mg by mouth daily.     tamoxifen (NOLVADEX) 20 MG tablet Take 20 mg by mouth daily.     traZODone (DESYREL) 150 MG tablet Take 150 mg by mouth at bedtime.     Venlafaxine HCl 225 MG TB24 Take 225 mg by mouth daily.     rosuvastatin (CRESTOR) 10 MG tablet Take 10 mg by mouth daily. (Patient not taking: Reported on 05/18/2022)      PTA Medications:  Facility Ordered Medications  Medication   acetaminophen (TYLENOL) tablet 650 mg   alum & mag hydroxide-simeth (MAALOX/MYLANTA) 200-200-20 MG/5ML suspension 30 mL   magnesium hydroxide (MILK OF MAGNESIA) suspension 30 mL   hydrOXYzine (ATARAX) tablet 25 mg   traZODone (DESYREL) tablet 150 mg   [COMPLETED] LORazepam (ATIVAN) tablet 2 mg   venlafaxine XR (EFFEXOR-XR) 24 hr capsule 225 mg   loratadine (CLARITIN) tablet 10 mg   naproxen (NAPROSYN) tablet 500 mg   [COMPLETED] hydrochlorothiazide (HYDRODIURIL) tablet 25 mg   LORazepam (ATIVAN) tablet 1 mg   rosuvastatin (CRESTOR) tablet 10 mg   tamoxifen (NOLVADEX) tablet 20 mg   metFORMIN (GLUCOPHAGE) tablet 850 mg   [COMPLETED] potassium chloride SA (KLOR-CON M) CR tablet 40 mEq   insulin aspart (novoLOG) injection 0-6 Units   PTA Medications  Medication Sig   traZODone  (DESYREL) 150 MG tablet Take 150 mg by mouth at bedtime.   esomeprazole (NEXIUM) 20 MG capsule Take 20 mg by mouth daily.   ALPRAZolam (XANAX) 1 MG tablet Take 0.5-1 mg by mouth 2 (two) times daily as needed (For severe anxiety).   cetirizine (ZYRTEC) 10 MG tablet Take 10 mg by mouth daily.   tamoxifen (NOLVADEX) 20 MG tablet Take 20 mg by mouth daily.   hydrochlorothiazide (HYDRODIURIL) 25 MG tablet Take 25 mg by mouth daily.   Venlafaxine HCl 225 MG TB24 Take 225 mg by mouth daily.   amLODipine (NORVASC) 10 MG tablet Take 10 mg by mouth daily.   celecoxib (CELEBREX) 200 MG  capsule Take 200 mg by mouth daily as needed for mild pain.   rosuvastatin (CRESTOR) 10 MG tablet Take 10 mg by mouth daily. (Patient not taking: Reported on 05/18/2022)        No data to display          Flowsheet Row ED from 05/18/2022 in [facility]  C-SSRS RISK CATEGORY No Risk       Musculoskeletal  Strength & Muscle Tone: within normal limits Gait & Station: normal Patient leans: N/A  Psychiatric Specialty Exam  Presentation  General Appearance:  Appropriate for Environment  Eye Contact: Fleeting  Speech: Blocked  Speech Volume: Normal  Handedness: Right   Mood and Affect  Mood: Depressed; Hopeless; Worthless  Affect: Tearful; Full Range   Thought Process  Thought Processes: Disorganized; Linear  Descriptions of Associations:Circumstantial  Orientation:Full (Time, Place and Person)  Thought Content:Rumination; Scattered  Diagnosis of Schizophrenia or Schizoaffective disorder in past: No    Hallucinations:Hallucinations: None  Ideas of Reference:None  Suicidal Thoughts:Suicidal Thoughts: Yes, Passive  Homicidal Thoughts:Homicidal Thoughts: Yes, Passive (Passive thoughts of wanting kill people on her family that hurt her "but on the other hand everytime they need me I show-up for them" "they're my weakness") HI Passive Intent and/or Plan:  Without Intent   Sensorium  Memory: Immediate Good; Recent Good; Remote Good  Judgment: Poor  Insight: Poor   Executive Functions  Concentration: Poor  Attention Span: Poor  Recall: Poor  Fund of Knowledge: Fair  Language: Good   Psychomotor Activity  Psychomotor Activity: Psychomotor Activity: Normal   Assets  Assets: Communication Skills; Desire for Improvement; Physical Health; Resilience   Sleep  Sleep: Sleep: Poor (only able to sleep with medication. If she takes medication gets approximately 8 hours)   No data recorded  Physical Exam  Physical Exam Vitals and nursing note reviewed.  Constitutional:      General: She is not in acute distress.    Appearance: Normal appearance. She is not ill-appearing.  HENT:     Head: Normocephalic.  Eyes:     General:        Right eye: No discharge.        Left eye: No discharge.     Conjunctiva/sclera: Conjunctivae normal.  Cardiovascular:     Rate and Rhythm: Normal rate.  Pulmonary:     Effort: Pulmonary effort is normal.  Musculoskeletal:        General: Normal range of motion.     Cervical back: Normal range of motion.  Skin:    Coloration: Skin is not jaundiced or pale.  Neurological:     Mental Status: She is alert and oriented to person, place, and time.  Psychiatric:        Attention and Perception: Attention and perception normal.        Mood and Affect: Affect normal. Mood is depressed.        Speech: Speech normal.        Behavior: Behavior normal. Behavior is cooperative.        Thought Content: Thought content includes suicidal ideation.        Cognition and Memory: Cognition normal.        Judgment: Judgment normal.    Review of Systems  Constitutional: Negative.   HENT: Negative.    Eyes: Negative.   Respiratory: Negative.    Cardiovascular: Negative.   Musculoskeletal: Negative.   Skin: Negative.   Neurological: Negative.   Psychiatric/Behavioral:  Positive for  depression and substance abuse.    Blood pressure (!) 150/90, pulse 68, temperature 98.3 F (36.8 C), temperature source Oral, resp. rate 18, SpO2 100 %. There is no height or weight on file to calculate BMI.    Disposition:  Discharge patient and readmit to [facility] for inpatient psychiatric admission.  Ardis Hughs, NP 05/19/2022, 9:23 AM

## 2022-05-19 NOTE — Discharge Instructions (Signed)
t was accepted to [facility] TODAY 05/18/2022; Bed Assignment PENDING Repeat K+  Pt meets inpatient criteria per Joaquin Courts, FNP  Attending Physician will be Dr. Phineas Inches, MD  Report can be called to: -Adult unit: [phone]  Pt can arrive after: Night [facility] Edythe Clarity, RN will communicate with care team upon completion of PENDING items.  Care Team notified: Day [facility] AC Rona Ravens, RN, Night [facility] Edythe Clarity, RN, Erskine Emery, NP, Harless Litten, RN, Joaquin Courts, NP, Adrian Prows, RN, [place], [place]
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
## 2022-05-19 NOTE — Tx Team (Signed)
Initial Treatment Plan 05/19/2022 4:59 PM [name] WJX:[id number]    PATIENT STRESSORS: Multiple Health problems Lack of support system   PATIENT STRENGTHS: Ability for insight    PATIENT IDENTIFIED PROBLEMS: Depression  Depression  Anxiety  Loneliness  Insomnia  Sadness  Crying spell         DISCHARGE CRITERIA:  Ability to meet basic life and health needs Adequate post-discharge living arrangements Verbal commitment to aftercare and medication compliance  PRELIMINARY DISCHARGE PLAN: Outpatient therapy Return to previous living arrangement  PATIENT/FAMILY INVOLVEMENT: This treatment plan has been presented to and reviewed with the patient, Janice Vaughan, The patient, has been given the opportunity to ask questions and make suggestions.  Melvenia Needles, RN 05/19/2022, 4:59 PM

## 2022-05-19 NOTE — ED Notes (Signed)
Report given to linsey rn [facility]

## 2022-05-19 NOTE — Plan of Care (Signed)
  Problem: Coping: Goal: Ability to verbalize frustrations and anger appropriately will improve Outcome: Progressing Goal: Ability to demonstrate self-control will improve Outcome: Progressing   

## 2022-05-20 ENCOUNTER — Encounter (HOSPITAL_COMMUNITY): Payer: Self-pay

## 2022-05-20 DIAGNOSIS — F332 Major depressive disorder, recurrent severe without psychotic features: Secondary | ICD-10-CM

## 2022-05-20 LAB — GLUCOSE, CAPILLARY
Glucose-Capillary: 128 mg/dL — ABNORMAL HIGH (ref 70–99)
Glucose-Capillary: 96 mg/dL (ref 70–99)
Glucose-Capillary: 120 mg/dL — ABNORMAL HIGH (ref 70–99)
Glucose-Capillary: 131 mg/dL — ABNORMAL HIGH (ref 70–99)

## 2022-05-20 MED ORDER — BENZTROPINE MESYLATE 1 MG/ML IJ SOLN: 1.0000 mg | INTRAMUSCULAR | Status: DC | PRN

## 2022-05-20 MED ORDER — HALOPERIDOL 5 MG PO TABS: 5.0000 mg | ORAL_TABLET | ORAL | Status: DC | PRN

## 2022-05-20 MED ORDER — LORAZEPAM 2 MG/ML IJ SOLN: 2.0000 mg | INTRAMUSCULAR | Status: DC | PRN

## 2022-05-20 MED ORDER — BENZTROPINE MESYLATE 1 MG PO TABS: 1.0000 mg | ORAL_TABLET | ORAL | Status: DC | PRN

## 2022-05-20 MED ORDER — HALOPERIDOL LACTATE 5 MG/ML IJ SOLN: 5.0000 mg | INTRAMUSCULAR | Status: DC | PRN

## 2022-05-20 MED ORDER — LORAZEPAM 1 MG PO TABS: 2.0000 mg | ORAL_TABLET | ORAL | Status: DC | PRN

## 2022-05-20 MED ORDER — ARIPIPRAZOLE 5 MG PO TABS
5.0000 mg | ORAL_TABLET | Freq: Every day | ORAL | Status: DC
  Administered 2022-05-20 – 2022-05-21 (×2): 5 mg via ORAL
  Filled 2022-05-20 (×5): qty 1

## 2022-05-20 NOTE — Progress Notes (Signed)
Staff reports patient has not been cooperative this morning refused all her medications, vitals were obtained this morning with normal blood pressure noted, fingerstick blood sugar obtained this morning no significant elevation noted.  When attempted to evaluate this patient during treatment team patient presents irritable and restless refusing to cooperate, noting she signed herself in and she wants to be, when tried to engage her in conversation and asking her when she signed herself voluntarily what was her goal for being in the hospital she responds "I had no goal" when attempted to ask her if she had a thoughts to harm herself prior to coming here she gets sarcastic "let me think did not have a thoughts of harming myself what is stupid question oh yeah yes I did" when redirected patient on the importance of questions being asked him to take them seriously and attempted to aspirate again she continues not to give clear answers same way when attempted to ask her if she would harm herself if she goes home.  Patient refuses to sign for medical treatment team gets up and leaves an angry manner pushing her room door.  Given impulsivity and unpredictability and current irritability will have patient placed on one-to-one to ensure safety of patient and others.  Will monitor and reassess if continues to be irritable and unpredictable will transfer to 500 hall, will follow.  Orders were adjusted for agitation protocol Haldol, Ativan and Cogentin p.o. or IM as needed for agitation, will follow.

## 2022-05-20 NOTE — Progress Notes (Signed)
    05/19/22 2037  Vital Signs  Pulse Rate 83  BP (!) 182/103  BP Location Left Arm  BP Method Automatic  Patient Position (if appropriate) Sitting    Recheck   05/19/22 2046  Vital Signs  Temp 98.4 F (36.9 C)  Temp Source Oral  Pulse Rate 80  BP (!) 180/102  BP Method Automatic  Patient Position (if appropriate) Sitting   Provider notified.   Manual Check   05/19/22 2115  Vital Signs  BP (!) 175/92  BP Location Left Arm  BP Method Manual  Patient Position (if appropriate) Sitting    Provider notified.  Administered one time dose of Clonidine and PRN Ativan per [facility] per patient request.

## 2022-05-20 NOTE — Progress Notes (Signed)
Pt belligerent during treatment team meeting.  Pt being sarcastic when responding to questions.  Pt agitated and refused to sign goals sheet.  Dr. Abbott Pao ordered 1:1.  [facility] and charge RN notified.

## 2022-05-20 NOTE — Plan of Care (Signed)
  Problem: Activity: Goal: Interest or engagement in activities will improve Outcome: Progressing   
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
## 2022-05-20 NOTE — Progress Notes (Signed)
     05/20/22 2133  Psych Admission Type (Psych Patients Only)  Admission Status Voluntary  Psychosocial Assessment  Patient Complaints Irritability  Eye Contact Fair  Facial Expression Anxious  Affect Depressed;Anxious  Speech Logical/coherent  Interaction Cautious;Guarded  Motor Activity Slow  Appearance/Hygiene Unremarkable  Behavior Characteristics Cooperative  Mood Depressed  Thought Process  Coherency WDL  Content WDL  Delusions None reported or observed  Perception WDL  Hallucination None reported or observed  Judgment Poor  Confusion None  Danger to Self  Current suicidal ideation? Denies  Self-Injurious Behavior No self-injurious ideation or behavior indicators observed or expressed   Agreement Not to Harm Self Yes  Description of Agreement verbal  Danger to Others  Danger to Others None reported or observed

## 2022-05-20 NOTE — BHH Group Notes (Signed)
Patient attended the NA group. ?

## 2022-05-20 NOTE — Progress Notes (Addendum)
     05/19/22 2149  Psych Admission Type (Psych Patients Only)  Admission Status Voluntary  Psychosocial Assessment  Patient Complaints Depression;Anxiety;Agitation;Crying spells;Worrying  Eye Contact Fair  Facial Expression Flat;Sad;Pained  Affect Depressed;Anxious;Sad  Speech Logical/coherent  Interaction Cautious;Guarded  Motor Activity Slow  Appearance/Hygiene Unremarkable  Behavior Characteristics Cooperative  Mood Depressed  Thought Process  Coherency WDL  Content WDL  Delusions None reported or observed  Perception Hallucinations  Hallucination Visual  Judgment Poor  Confusion None  Danger to Self  Current suicidal ideation? Denies  Self-Injurious Behavior No self-injurious ideation or behavior indicators observed or expressed   Agreement Not to Harm Self Yes  Description of Agreement verbal  Danger to Others  Danger to Others None reported or observed   Patient described VH as "things flying around."

## 2022-05-20 NOTE — BH IP Treatment Plan (Signed)
Interdisciplinary Treatment and Diagnostic Plan Update  05/20/2022 Time of Session: 10:40 AM  Blia Totman MRN: [id number]  Principal Diagnosis: MDD (major depressive disorder), recurrent severe, without psychosis (HCC)  Secondary Diagnoses: Principal Problem:   MDD (major depressive disorder), recurrent severe, without psychosis (HCC) Active Problems:   Suicidal ideations   Current Medications:  Current Facility-Administered Medications  Medication Dose Route Frequency Provider Last Rate Last Admin   acetaminophen (TYLENOL) tablet 650 mg  650 mg Oral Q6H PRN Bing Neighbors, NP       alum & mag hydroxide-simeth (MAALOX/MYLANTA) 200-200-20 MG/5ML suspension 30 mL  30 mL Oral Q4H PRN Bing Neighbors, NP       haloperidol (HALDOL) tablet 5 mg  5 mg Oral Q4H PRN Abbott Pao, Nadir, MD       And   LORazepam (ATIVAN) tablet 2 mg  2 mg Oral Q4H PRN Abbott Pao, Nadir, MD       And   benztropine (COGENTIN) tablet 1 mg  1 mg Oral Q4H PRN Abbott Pao, Nadir, MD       haloperidol lactate (HALDOL) injection 5 mg  5 mg Intramuscular Q4H PRN Abbott Pao, Nadir, MD       And   LORazepam (ATIVAN) injection 2 mg  2 mg Intramuscular Q4H PRN Abbott Pao, Nadir, MD       And   benztropine mesylate (COGENTIN) injection 1 mg  1 mg Intramuscular Q4H PRN Abbott Pao, Nadir, MD       hydrochlorothiazide (HYDRODIURIL) tablet 25 mg  25 mg Oral Daily Bing Neighbors, NP   25 mg at 05/19/22 1610   hydrOXYzine (ATARAX) tablet 25 mg  25 mg Oral TID PRN Bing Neighbors, NP       insulin aspart (novoLOG) injection 0-6 Units  0-6 Units Subcutaneous BID BM & HS Bing Neighbors, NP       loratadine (CLARITIN) tablet 10 mg  10 mg Oral Daily Bing Neighbors, NP       magnesium hydroxide (MILK OF MAGNESIA) suspension 30 mL  30 mL Oral Daily PRN Bing Neighbors, NP       metFORMIN (GLUCOPHAGE) tablet 850 mg  850 mg Oral Q breakfast Bing Neighbors, NP       naproxen (NAPROSYN) tablet 500 mg  500 mg Oral BID PRN Bing Neighbors, NP       potassium chloride SA (KLOR-CON M) CR tablet 20 mEq  20 mEq Oral Daily Bing Neighbors, NP   20 mEq at 05/19/22 1610   rosuvastatin (CRESTOR) tablet 10 mg  10 mg Oral Daily Bing Neighbors, NP       tamoxifen (NOLVADEX) tablet 20 mg  20 mg Oral Daily Bing Neighbors, NP       traZODone (DESYREL) tablet 150 mg  150 mg Oral QHS Bing Neighbors, NP   150 mg at 05/19/22 2149   venlafaxine XR (EFFEXOR-XR) 24 hr capsule 225 mg  225 mg Oral Q breakfast Bing Neighbors, NP       PTA Medications: Medications Prior to Admission  Medication Sig Dispense Refill Last Dose   ALPRAZolam (XANAX) 1 MG tablet Take 0.5-1 mg by mouth 2 (two) times daily as needed (For severe anxiety).      amLODipine (NORVASC) 10 MG tablet Take 10 mg by mouth daily.      celecoxib (CELEBREX) 200 MG capsule Take 200 mg by mouth daily as needed for mild pain.      cetirizine (ZYRTEC)  10 MG tablet Take 10 mg by mouth daily.      esomeprazole (NEXIUM) 20 MG capsule Take 20 mg by mouth daily.      hydrochlorothiazide (HYDRODIURIL) 25 MG tablet Take 25 mg by mouth daily.      metFORMIN (GLUCOPHAGE) 850 MG tablet Take 1 tablet (850 mg total) by mouth daily with breakfast.      rosuvastatin (CRESTOR) 10 MG tablet Take 10 mg by mouth daily. (Patient not taking: Reported on 05/18/2022)      tamoxifen (NOLVADEX) 20 MG tablet Take 20 mg by mouth daily.      traZODone (DESYREL) 150 MG tablet Take 150 mg by mouth at bedtime.      venlafaxine XR (EFFEXOR-XR) 75 MG 24 hr capsule Take 3 capsules (225 mg total) by mouth daily with breakfast.       Patient Stressors:    Patient Strengths: Ability for insight   Treatment Modalities: Medication Management, Group therapy, Case management,  1 to 1 session with clinician, Psychoeducation, Recreational therapy.   Physician Treatment Plan for Primary Diagnosis: MDD (major depressive disorder), recurrent severe, without psychosis (HCC) Long Term Goal(s):      Short Term Goals:    Medication Management: Evaluate patient's response, side effects, and tolerance of medication regimen.  Therapeutic Interventions: 1 to 1 sessions, Unit Group sessions and Medication administration.  Evaluation of Outcomes: Not Progressing  Physician Treatment Plan for Secondary Diagnosis: Principal Problem:   MDD (major depressive disorder), recurrent severe, without psychosis (HCC) Active Problems:   Suicidal ideations  Long Term Goal(s):     Short Term Goals:       Medication Management: Evaluate patient's response, side effects, and tolerance of medication regimen.  Therapeutic Interventions: 1 to 1 sessions, Unit Group sessions and Medication administration.  Evaluation of Outcomes: Not Progressing   RN Treatment Plan for Primary Diagnosis: MDD (major depressive disorder), recurrent severe, without psychosis (HCC) Long Term Goal(s): Knowledge of disease and therapeutic regimen to maintain health will improve  Short Term Goals: Ability to remain free from injury will improve, Ability to verbalize frustration and anger appropriately will improve, Ability to demonstrate self-control, Ability to participate in decision making will improve, Ability to verbalize feelings will improve, Ability to disclose and discuss suicidal ideas, Ability to identify and develop effective coping behaviors will improve, and Compliance with prescribed medications will improve  Medication Management: RN will administer medications as ordered by provider, will assess and evaluate patient's response and provide education to patient for prescribed medication. RN will report any adverse and/or side effects to prescribing provider.  Therapeutic Interventions: 1 on 1 counseling sessions, Psychoeducation, Medication administration, Evaluate responses to treatment, Monitor vital signs and CBGs as ordered, Perform/monitor CIWA, COWS, AIMS and Fall Risk screenings as ordered, Perform wound  care treatments as ordered.  Evaluation of Outcomes: Not Progressing   LCSW Treatment Plan for Primary Diagnosis: MDD (major depressive disorder), recurrent severe, without psychosis (HCC) Long Term Goal(s): Safe transition to appropriate next level of care at discharge, Engage patient in therapeutic group addressing interpersonal concerns.  Short Term Goals: Engage patient in aftercare planning with referrals and resources, Increase social support, Increase ability to appropriately verbalize feelings, Increase emotional regulation, Facilitate acceptance of mental health diagnosis and concerns, Facilitate patient progression through stages of change regarding substance use diagnoses and concerns, Identify triggers associated with mental health/substance abuse issues, and Increase skills for wellness and recovery  Therapeutic Interventions: Assess for all discharge needs, 1 to 1 time with  Social worker, Explore available resources and support systems, Assess for adequacy in community support network, Educate family and significant other(s) on suicide prevention, Complete Psychosocial Assessment, Interpersonal group therapy.  Evaluation of Outcomes: Not Progressing   Progress in Treatment: Attending groups: Yes. Participating in groups: Yes. Taking medication as prescribed: No. Pt refused her medications  Toleration medication: No. Family/Significant other contact made: No, will contact:  pt declined  Patient understands diagnosis: No. Discussing patient identified problems/goals with staff: Yes. Medical problems stabilized or resolved: Yes. Denies suicidal/homicidal ideation: No.Pt did not want to answer question seriously  Issues/concerns per patient self-inventory: No. PT WAS VERY IRRITABLE AND LAUGHING WHILE TRYING TO FIGURE OUT HER GOALS AND THE REASON FOR HER BEING HERE  New problem(s) identified: No, Describe:  None Reported   New Short Term/Long Term Goal(s):medication  stabilization, elimination of SI thoughts, development of comprehensive mental wellness plan.    Patient Goals: " I have no goals "    Discharge Plan or Barriers: Patient recently admitted. CSW will continue to follow and assess for appropriate referrals and possible discharge planning.    Reason for Continuation of Hospitalization: Aggression Anxiety Depression Medication stabilization Suicidal ideation  Estimated Length of Stay: 3-5 days   Last 3 [place] Suicide Severity Risk Score: Flowsheet Row [facility] (Current[facility]18/[facility]22 in [facility]  C-SSRS RISK CATEGORY Error: Q3, 4, or 5 should not be populated when Q2 is No No Risk       Last PHQ 2/9 Scores:     No data to display          Scribe for Treatment Team: Beather Arbour 05/20/2022 11:34 AM

## 2022-05-20 NOTE — Progress Notes (Addendum)
Writer was notify that patient BP was elevated,  new order was given for Clonidine 0.1mg 

## 2022-05-20 NOTE — Progress Notes (Signed)
Pt in bed. Pt refusing to come out of room and is refusing medications. Nurse reviewed medications with pt and educated pt about importance of compliance. Pt continues to refuse. Pt presents with irritable affect, asking for the light to be turned off. Pt denies SI/HI/AVH at this time.
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
## 2022-05-20 NOTE — Group Note (Signed)
Date:  05/20/2022 Time:  4:22 PM  Group Topic/Focus:  Goals Group:   The focus of this group is to help patients establish daily goals to achieve during treatment and discuss how the patient can incorporate goal setting into their daily lives to aide in recovery. Orientation:   The focus of this group is to educate the patient on the purpose and policies of crisis stabilization and provide a format to answer questions about their admission.  The group details unit policies and expectations of patients while admitted.    Participation Level:  Did Not Attend  Participation Quality:   n/a  Affect:   n/a  Cognitive:   n/a  Insight: None  Engagement in Group:   n/a  Modes of Intervention:   n/a  Additional Comments:   Pt did not attend.  Janice Vaughan 05/20/2022, 4:22 PM

## 2022-05-20 NOTE — Group Note (Signed)
Recreation Therapy Group Note   Group Topic:Other  Group Date: 05/20/2022 Start Time: 1400 End Time: 1443 Facilitators: Sherrel Shafer-McCall, LRT,CTRS Location: [address] Dayroom   Activity Description/Intervention: Therapeutic Drumming. Patients with peers and staff were given the opportunity to engage in a leader facilitated [facility] Group Empowerment Drumming Circle with staff from the [employer], in partnership with [employer]. Teaching laboratory technician and trained [employer], Theodoro Doing leading with LRT observing and documenting intervention and pt response. This evidenced-based practice targets 7 areas of health and wellbeing in the human experience including: stress-reduction, exercise, self-expression, camaraderie/support, nurturing, spirituality, and music-making (leisure).   Goal Area(s) Addresses:  Patient will engage in pro-social way in music group.  Patient will follow directions of drum leader on the first prompt. Patient will demonstrate no behavioral issues during group.  Patient will identify if a reduction in stress level occurs as a result of participation in therapeutic drum circle.     Education: Leisure exposure, Pharmacologist, Musical expression, Discharge Planning    Affect/Mood: N/A   Participation Level: Did not attend    Clinical Observations/Individualized Feedback:     Plan: Continue to engage patient in RT group sessions 2-3x/week.   Charmin Aguiniga-McCall, LRT,CTRS  05/20/2022 3:21 PM
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
## 2022-05-21 LAB — GLUCOSE, CAPILLARY
Glucose-Capillary: 132 mg/dL — ABNORMAL HIGH (ref 70–99)
Glucose-Capillary: 139 mg/dL — ABNORMAL HIGH (ref 70–99)
Glucose-Capillary: 111 mg/dL — ABNORMAL HIGH (ref 70–99)
Glucose-Capillary: 109 mg/dL — ABNORMAL HIGH (ref 70–99)

## 2022-05-21 MED ORDER — PANTOPRAZOLE SODIUM 40 MG PO TBEC
40.0000 mg | DELAYED_RELEASE_TABLET | Freq: Every day | ORAL | Status: DC
  Administered 2022-05-21 – 2022-05-26 (×6): 40 mg via ORAL
  Filled 2022-05-21 (×9): qty 1

## 2022-05-21 NOTE — Group Note (Signed)
Date:  05/21/2022 Time:  9:46 AM  Group Topic/Focus:  Goals Group:   The focus of this group is to help patients establish daily goals to achieve during treatment and discuss how the patient can incorporate goal setting into their daily lives to aide in recovery. Orientation:   The focus of this group is to educate the patient on the purpose and policies of crisis stabilization and provide a format to answer questions about their admission.  The group details unit policies and expectations of patients while admitted.    Participation Level:  Active  Participation Quality:  Appropriate  Affect:  Appropriate  Cognitive:  Appropriate  Insight: Appropriate  Engagement in Group:  Engaged  Modes of Intervention:  Discussion  Additional Comments:  Pt wants to meet with care team and listen and be respectful  Jaquita Rector 05/21/2022, 9:46 AM

## 2022-05-21 NOTE — BHH Group Notes (Signed)
Spiritual care group on grief and loss facilitated by Chaplain Katy Montana Bryngelson, [facility]  Group Goal: Support / Education around grief and loss  Members engage in facilitated group support and psycho-social education.  Group Description:  Following introductions and group rules, group members engaged in facilitated group dialogue and support around topic of loss, with particular support around experiences of loss in their lives. Group Identified types of loss (relationships / self / things) and identified patterns, circumstances, and changes that precipitate losses. Reflected on thoughts / feelings around loss, normalized grief responses, and recognized variety in grief experience. Group encouraged individual reflection on safe space and on the coping skills that they are already utilizing.  Group drew on Adlerian / Rogerian and narrative framework  Patient Progress: Did not attend.  

## 2022-05-21 NOTE — Progress Notes (Signed)
Pt found sited in the day room calm, alert and oriented x 4, patient is participating appropriately with staff and peers in the milieu, with no acute distress. Pt denies having Suicidal thoughts at this time. Pt also denies auditory and visual hallucination, rated anxiety and depression at a zero. Pt attended group activities this evening. Pt was compliant with meds and snacks. Q 15 minutes safety checks in progress, will continue to monitor and follow plan of care as ordered.

## 2022-05-21 NOTE — Plan of Care (Signed)
  Problem: Activity: Goal: Interest or engagement in activities will improve Outcome: Progressing Goal: Sleeping patterns will improve Outcome: Progressing   

## 2022-05-21 NOTE — Group Note (Signed)
Date:  05/21/2022 Time:  10:02 AM  Group Topic/Focus:  Goals Group:   The focus of this group is to help patients establish daily goals to achieve during treatment and discuss how the patient can incorporate goal setting into their daily lives to aide in recovery. Orientation:   The focus of this group is to educate the patient on the purpose and policies of crisis stabilization and provide a format to answer questions about their admission.  The group details unit policies and expectations of patients while admitted.    Participation Level:    Participation Quality:    Affect:    Cognitive:    Insight:   Engagement in Group:    Modes of Intervention:    Additional Comments:    Jaquita Rector 05/21/2022, 10:02 AM

## 2022-05-21 NOTE — Plan of Care (Signed)
  Problem: Education: Goal: Emotional status will improve Outcome: Progressing Goal: Mental status will improve Outcome: Progressing Goal: Verbalization of understanding the information provided will improve Outcome: Progressing   Problem: Activity: Goal: Interest or engagement in activities will improve Outcome: Progressing Goal: Sleeping patterns will improve Outcome: Progressing   

## 2022-05-21 NOTE — Group Note (Signed)
Occupational Therapy Group Note  Group Topic: Sleep Hygiene  Group Date: 05/21/2022 Start Time: 1430 End Time: 1500 Facilitators: Ted Mcalpine, OT   Group Description: Group encouraged increased participation and engagement through topic focused on sleep hygiene. Patients reflected on the quality of sleep they typically receive and identified areas that need improvement. Group was given background information on sleep and sleep hygiene, including common sleep disorders. Group members also received information on how to improve one's sleep and introduced a sleep diary as a tool that can be utilized to track sleep quality over a length of time. Group session ended with patients identifying one or more strategies they could utilize or implement into their sleep routine in order to improve overall sleep quality.        Therapeutic Goal(s):  Identify one or more strategies to improve overall sleep hygiene  Identify one or more areas of sleep that are negatively impacted (sleep too much, too little, etc)     Participation Level: Engaged   Participation Quality: Independent   Behavior: Appropriate   Speech/Thought Process: Coherent   Affect/Mood: Appropriate   Insight: Fair   Judgement: Fair      Modes of Intervention: Education  Patient Response to Interventions:  Attentive   Plan: Continue to engage patient in OT groups 2 - 3x/week.  05/21/2022  Ted Mcalpine, OT  Kerrin Champagne, OT

## 2022-05-21 NOTE — Progress Notes (Addendum)
   1:1  - Nursing Note Time:  2200   Patient is observed exiting the dayroom.  Patient is alert, oriented, and calm.  No distress can be noted.  Patient is accompanied by [employer].  1:1 patient care remains in effect.  Patient is safe on the unit with q15 minute safety checks.  Will continue to monitor.

## 2022-05-21 NOTE — Group Note (Unsigned)
Date:  05/21/2022 Time:  10:00 AM  Group Topic/Focus:  Goals Group:   The focus of this group is to help patients establish daily goals to achieve during treatment and discuss how the patient can incorporate goal setting into their daily lives to aide in recovery. Orientation:   The focus of this group is to educate the patient on the purpose and policies of crisis stabilization and provide a format to answer questions about their admission.  The group details unit policies and expectations of patients while admitted.     Participation Level:  {BHH PARTICIPATION LEVEL:22264}  Participation Quality:  {BHH PARTICIPATION QUALITY:22265}  Affect:  {BHH AFFECT:22266}  Cognitive:  {BHH COGNITIVE:22267}  Insight: {BHH Insight2:20797}  Engagement in Group:  {BHH ENGAGEMENT IN GROUP:22268}  Modes of Intervention:  {BHH MODES OF INTERVENTION:22269}  Additional Comments:  ***  Janice Vaughan 05/21/2022, 10:00 AM  

## 2022-05-21 NOTE — Progress Notes (Signed)
[facility] MD Progress Note  05/21/2022 4:26 PM Janice Vaughan  MRN:  [id number]   Subjective:   The patient is a 52 year old female with a past psychiatric history of major depressive disorder, diagnosed by her outpatient psychiatric provider.  She presented voluntarily to the [facility] behavioral urgent care for suicidal thoughts.  Patient is admitted on a voluntary basis to the [facility] behavioral [facility].   Per nursing report, the patient has exhibited improvement compared to her behavior from yesterday.  She has been cooperative and pleasant.  On interview 5/2, the patient reports stable and unchanged depression and anxiety.  She denies experiencing any suicidal thoughts.  She reports participation in groups and says that she has benefited from going.  She feels that she did not sleep well last night but says that this is an aberration.  She requests to start a medication for heartburn, similar to the Nexium that she takes at home.  She reports some nausea and diarrhea but states that this is not a new symptom and she has had this for a while.  The patient apologizes to the attending psychiatrist for her behavior yesterday.  She contracts for safety and it is discussed that the one-to-one monitoring will be discontinued.  The patient agrees and is grateful.  Erielle Gawronski, mother, who lives around the corner, at [phone].  She reports that she does not talk to the patient frequently, going weeks at a time without talking to her.  (The patient states that her mother is the person who knows her best).  She says that they are not on great terms because they have disagreements frequently.  The patient's mother's speech is relatively limited and she does not appear interested in disclosing information.  She does say that she does not feel the patient has ever harmed herself or attempted suicide.    Principal Problem: MDD (major depressive disorder), recurrent severe, without psychosis (HCC) Diagnosis:  Principal Problem:   MDD (major depressive disorder), recurrent severe, without psychosis (HCC) Active Problems:   Suicidal ideations   Total Time spent with patient: 20 min  Past Psychiatric History: as per H and P  Past Medical History:  Past Medical History:  Diagnosis Date   Anxiety    Cellulitis of breast    DM (diabetes mellitus) (HCC)    GERD (gastroesophageal reflux disease)    History of malignant neoplasm of breast    History of psychological trauma    Hypertension    Lymphedema    Pruritic rash     Past Surgical History:  Procedure Laterality Date   KNEE SURGERY Left 02/05/2012   Family History:  Family History  Problem Relation Age of Onset   Hypertension Mother    Diabetes Mother    Kidney disease Mother    Schizophrenia Son    Bipolar disorder Son    Family Psychiatric  History: as per H and P Social History:  Social History   Substance and Sexual Activity  Alcohol Use Yes   Alcohol/week: 18.0 standard drinks of alcohol   Types: 18 Cans of beer per week   Comment: about 18 cans of beer per week      Social History   Substance and Sexual Activity  Drug Use No    Social History   Socioeconomic History   Marital status: Single    Spouse name: Not on file   Number of children: Not on file   Years of education: Not on file   Highest education  level: Not on file  Occupational History   Not on file  Tobacco Use   Smoking status: Never   Smokeless tobacco: Never  Vaping Use   Vaping Use: Not on file  Substance and Sexual Activity   Alcohol use: Yes    Alcohol/week: 18.0 standard drinks of alcohol    Types: 18 Cans of beer per week    Comment: about 18 cans of beer per week    Drug use: No   Sexual activity: Not on file  Other Topics Concern   Not on file  Social History Narrative   Not on file   Social Determinants of Health   Financial Resource Strain: Not on file  Food Insecurity: No Food Insecurity (05/19/2022)   Hunger Vital  Sign    Worried About Running Out of Food in the Last Year: Never true    Ran Out of Food in the Last Year: Never true  Transportation Needs: No Transportation Needs (05/19/2022)   PRAPARE - Administrator, Civil Service (Medical): No    Lack of Transportation (Non-Medical): No  Physical Activity: Not on file  Stress: Not on file  Social Connections: Not on file   Additional Social History:                         Sleep: fair  Appetite: fair  Current Medications: Current Facility-Administered Medications  Medication Dose Route Frequency Provider Last Rate Last Admin   acetaminophen (TYLENOL) tablet 650 mg  650 mg Oral Q6H PRN Bing Neighbors, NP       alum & mag hydroxide-simeth (MAALOX/MYLANTA) 200-200-20 MG/5ML suspension 30 mL  30 mL Oral Q4H PRN Bing Neighbors, NP       ARIPiprazole (ABILIFY) tablet 5 mg  5 mg Oral QHS Carlyn Reichert, MD   5 mg at 05/20/22 2133   haloperidol (HALDOL) tablet 5 mg  5 mg Oral Q4H PRN Sarita Bottom, MD       And   LORazepam (ATIVAN) tablet 2 mg  2 mg Oral Q4H PRN Abbott Pao, Nadir, MD       And   benztropine (COGENTIN) tablet 1 mg  1 mg Oral Q4H PRN Abbott Pao, Nadir, MD       haloperidol lactate (HALDOL) injection 5 mg  5 mg Intramuscular Q4H PRN Abbott Pao, Nadir, MD       And   LORazepam (ATIVAN) injection 2 mg  2 mg Intramuscular Q4H PRN Abbott Pao, Nadir, MD       And   benztropine mesylate (COGENTIN) injection 1 mg  1 mg Intramuscular Q4H PRN Abbott Pao, Nadir, MD       hydrochlorothiazide (HYDRODIURIL) tablet 25 mg  25 mg Oral Daily Bing Neighbors, NP   25 mg at 05/21/22 0825   hydrOXYzine (ATARAX) tablet 25 mg  25 mg Oral TID PRN Bing Neighbors, NP   25 mg at 05/20/22 2133   loratadine (CLARITIN) tablet 10 mg  10 mg Oral Daily Bing Neighbors, NP   10 mg at 05/21/22 0825   magnesium hydroxide (MILK OF MAGNESIA) suspension 30 mL  30 mL Oral Daily PRN Bing Neighbors, NP       metFORMIN (GLUCOPHAGE) tablet 850 mg  850  mg Oral Q breakfast Bing Neighbors, NP   850 mg at 05/21/22 0825   naproxen (NAPROSYN) tablet 500 mg  500 mg Oral BID PRN Bing Neighbors, NP  pantoprazole (PROTONIX) EC tablet 40 mg  40 mg Oral Daily Carlyn Reichert, MD       potassium chloride SA (KLOR-CON M) CR tablet 20 mEq  20 mEq Oral Daily Bing Neighbors, NP   20 mEq at 05/21/22 0825   rosuvastatin (CRESTOR) tablet 10 mg  10 mg Oral Daily Bing Neighbors, NP   10 mg at 05/21/22 1159   tamoxifen (NOLVADEX) tablet 20 mg  20 mg Oral Daily Bing Neighbors, NP   20 mg at 05/21/22 0825   traZODone (DESYREL) tablet 150 mg  150 mg Oral QHS Bing Neighbors, NP   150 mg at 05/20/22 2133   venlafaxine XR (EFFEXOR-XR) 24 hr capsule 225 mg  225 mg Oral Q breakfast Bing Neighbors, NP   225 mg at 05/21/22 0825    Lab Results:  Results for orders placed or performed during the hospital encounter of 05/19/22 (from the past 48 hour(s))  Glucose, capillary     Status: Abnormal   Collection Time: 05/19/22  8:32 PM  Result Value Ref Range   Glucose-Capillary 143 (H) 70 - 99 mg/dL    Comment: Glucose reference range applies only to samples taken after fasting for at least 8 hours.   Comment 1 Notify RN    Comment 2 Document in Chart   Glucose, capillary     Status: Abnormal   Collection Time: 05/20/22  5:56 AM  Result Value Ref Range   Glucose-Capillary 128 (H) 70 - 99 mg/dL    Comment: Glucose reference range applies only to samples taken after fasting for at least 8 hours.   Comment 1 Notify RN   Glucose, capillary     Status: None   Collection Time: 05/20/22  2:09 PM  Result Value Ref Range   Glucose-Capillary 96 70 - 99 mg/dL    Comment: Glucose reference range applies only to samples taken after fasting for at least 8 hours.  Glucose, capillary     Status: Abnormal   Collection Time: 05/20/22  5:12 PM  Result Value Ref Range   Glucose-Capillary 120 (H) 70 - 99 mg/dL    Comment: Glucose reference range applies only  to samples taken after fasting for at least 8 hours.  Glucose, capillary     Status: Abnormal   Collection Time: 05/20/22  9:00 PM  Result Value Ref Range   Glucose-Capillary 131 (H) 70 - 99 mg/dL    Comment: Glucose reference range applies only to samples taken after fasting for at least 8 hours.  Glucose, capillary     Status: Abnormal   Collection Time: 05/21/22  5:56 AM  Result Value Ref Range   Glucose-Capillary 132 (H) 70 - 99 mg/dL    Comment: Glucose reference range applies only to samples taken after fasting for at least 8 hours.  Glucose, capillary     Status: Abnormal   Collection Time: 05/21/22 12:13 PM  Result Value Ref Range   Glucose-Capillary 139 (H) 70 - 99 mg/dL    Comment: Glucose reference range applies only to samples taken after fasting for at least 8 hours.    Blood Alcohol level:  Lab Results  Component Value Date   ETH <10 05/18/2022    Metabolic Disorder Labs: Lab Results  Component Value Date   HGBA1C 7.1 (H) 05/18/2022   MPG 157.07 05/18/2022   No results found for: "PROLACTIN" Lab Results  Component Value Date   CHOL 255 (H) 05/18/2022   TRIG 156 (H)  05/18/2022   HDL 54 05/18/2022   CHOLHDL 4.7 05/18/2022   VLDL 31 05/18/2022   LDLCALC 170 (H) 05/18/2022    Physical Findings:   Psychiatric Specialty Exam: Physical Exam Constitutional:      Appearance: the patient is not toxic-appearing.  Pulmonary:     Effort: Pulmonary effort is normal.  Neurological:     General: No focal deficit present.     Mental Status: the patient is alert and oriented to person, place, and time.   Review of Systems  Respiratory:  Negative for shortness of breath.   Cardiovascular:  Negative for chest pain.  Gastrointestinal:  Negative for abdominal pain, constipation, diarrhea, nausea and vomiting.  Neurological:  Negative for headaches.      BP (!) 123/90 (BP Location: Left Arm)   Pulse 92   Temp 98.3 F (36.8 C) (Oral)   Resp 18   Ht 5\' 4"  (1.626  m)   Wt 72 kg   SpO2 100%   BMI 27.26 kg/m   General Appearance: Fairly Groomed  Eye Contact:  Good  Speech:  Clear and Coherent  Volume:  Normal  Mood:  "alright"  Affect:  euthymic  Thought Process:  Coherent  Orientation:  Full (Time, Place, and Person)  Thought Content: Logical   Suicidal Thoughts:  No  Homicidal Thoughts:  No  Memory:  Immediate;   Good  Judgement:  fair  Insight:  fair  Psychomotor Activity:  Normal  Concentration:  Concentration: Good  Recall:  Good  Fund of Knowledge: Good  Language: Good  Akathisia:  No  Handed:  not assessed  AIMS (if indicated): not done  Assets:  Communication Skills Desire for Improvement Financial Resources/Insurance Housing Leisure Time Physical Health  ADL's:  Intact  Cognition: WNL  Sleep:  Fair      Treatment Plan Summary: Daily contact with patient to assess and evaluate symptoms and progress in treatment and Medication management  Diagnoses / Active Problems: Major depressive disorder Generalized anxiety disorder     PLAN: Safety and Monitoring:             --  Voluntary admission to inpatient psychiatric unit for safety, stabilization and treatment - One-to-one monitoring was discontinued             -- Daily contact with patient to assess and evaluate symptoms and progress in treatment             -- Patient's case to be discussed in multi-disciplinary team meeting             -- Observation Level : q15 minute checks             -- Vital signs:  q12 hours             -- Precautions: suicide, elopement, and assault   2. Psychiatric Diagnoses and Treatment:  -Continue home Effexor 225 mg daily - Continue home trazodone 150 mg nightly - Abilify 5 mg daily was added for augmentation of major depressive disorder treatment and treatment of lability and irritability                3. Medical Issues Being Addressed:              Labs review, notable for A1c of 7.1, CBGs unremarkable, mild hypokalemia of  3.2, otherwise unremarkable -Replace potassium, continue home potassium, check potassium 5/3 - Continue home metformin and tamoxifen and rosuvastatin - No CIWA protocol because the patient denies significant  alcohol use recently, denies using her Xanax regularly, and no objective signs of withdrawal presently.             Tobacco Use Disorder             -- Nicotine patch 21mg /24 hours ordered             -- Smoking cessation encouraged   4. Discharge Planning:              -- Social work and case management to assist with discharge planning and identification of hospital follow-up needs prior to discharge             -- Estimated LOS: 5-7 days             -- Discharge Concerns: Need to establish a safety plan; Medication compliance and effectiveness             -- Discharge Goals: Return home with outpatient referrals for mental health follow-up including medication management/psychotherapy    Carlyn Reichert, MD PGY-2

## 2022-05-21 NOTE — Progress Notes (Signed)
Pt medication compliant. Pt on unit today. 1:1 discontinued. Pt endorses depression and anxiety. Pt denies suicidal thoughts as well as HI and AVH. Q 15 minute checks ongoing.

## 2022-05-21 NOTE — Progress Notes (Signed)
    05/21/22 0556  15 Minute Checks  Location Bedroom  Visual Appearance Calm  Behavior Sleeping  Sleep (Behavioral Health Patients Only)  Calculate sleep? (Click Yes once per 24 hr at 0600 safety check) Yes  Documented sleep last 24 hours 7.75    

## 2022-05-21 NOTE — Group Note (Unsigned)
Date:  05/21/2022 Time:  1:36 PM  Group Topic/Focus:  Wellness Toolbox:   The focus of this group is to discuss various aspects of wellness, balancing those aspects and exploring ways to increase the ability to experience wellness.  Patients will create a wellness toolbox for use upon discharge.     Participation Level:  {BHH PARTICIPATION LEVEL:22264}  Participation Quality:  {BHH PARTICIPATION QUALITY:22265}  Affect:  {BHH AFFECT:22266}  Cognitive:  {BHH COGNITIVE:22267}  Insight: {BHH Insight2:20797}  Engagement in Group:  {BHH ENGAGEMENT IN GROUP:22268}  Modes of Intervention:  {BHH MODES OF INTERVENTION:22269}  Additional Comments:  ***  Tamberly Pomplun Lashawn Jayven Naill 05/21/2022, 1:36 PM  

## 2022-05-21 NOTE — Progress Notes (Signed)
   05/21/22 2015  Psych Admission Type (Psych Patients Only)  Admission Status Voluntary  Psychosocial Assessment  Patient Complaints Anxiety  Eye Contact Fair  Facial Expression Animated  Affect Appropriate to circumstance  Speech Logical/coherent  Interaction Assertive  Motor Activity Other (Comment)  Appearance/Hygiene Unremarkable  Behavior Characteristics Cooperative;Appropriate to situation  Mood Pleasant  Thought Process  Coherency WDL  Content WDL  Delusions None reported or observed  Perception WDL  Hallucination None reported or observed  Judgment Poor  Confusion None  Danger to Self  Current suicidal ideation? Denies  Agreement Not to Harm Self Yes  Description of Agreement pt verbally contracted for safety.  Danger to Others  Danger to Others None reported or observed

## 2022-05-21 NOTE — Group Note (Signed)
Date:  05/21/2022 Time:  1:49 PM  Group Topic/Focus:  Wellness Toolbox:   The focus of this group is to discuss various aspects of wellness, balancing those aspects and exploring ways to increase the ability to experience wellness.  Patients will create a wellness toolbox for use upon discharge.    Participation Level:  Active  Participation Quality:  Appropriate  Affect:  Appropriate  Cognitive:  Appropriate  Insight: Appropriate  Engagement in Group:  Engaged  Modes of Intervention:  Discussion  Additional Comments:    Jaquita Rector 05/21/2022, 1:49 PM

## 2022-05-21 NOTE — Progress Notes (Signed)
Child/Adolescent Psychoeducational Group Note  Date:  05/21/2022 Time:  9:52 PM  Group Topic/Focus:  Wrap-Up Group:   The focus of this group is to help patients review their daily goal of treatment and discuss progress on daily workbooks.  Participation Level:  Active  Participation Quality:  Appropriate  Affect:  Appropriate  Cognitive:  Appropriate  Insight:  Appropriate  Engagement in Group:  Engaged  Modes of Intervention:  Discussion and Support  Additional Comments:  Pt states goal today, was to be good. Pt rates day an 8/10 after getting off a 1:1. Pt states tomorrow, she wants to continue working on being good.  Janice Vaughan 05/21/2022, 9:52 PM

## 2022-05-21 NOTE — Progress Notes (Signed)
   1:1  - Nursing Note Time:  0200    Patient is observed in bed, sleeping, and covered with a blanket.  Patient is resting.  No distress can be noted.  Patient has a Energy manager at the bedside.  1:1 patient care remains in effect.  Patient is safe on the unit with q15 minute safety checks.  Will continue to monitor.

## 2022-05-21 NOTE — Group Note (Unsigned)
Date:  05/21/2022 Time:  9:40 AM  Group Topic/Focus:  Goals Group:   The focus of this group is to help patients establish daily goals to achieve during treatment and discuss how the patient can incorporate goal setting into their daily lives to aide in recovery. Orientation:   The focus of this group is to educate the patient on the purpose and policies of crisis stabilization and provide a format to answer questions about their admission.  The group details unit policies and expectations of patients while admitted.     Participation Level:  {BHH PARTICIPATION LEVEL:22264}  Participation Quality:  {BHH PARTICIPATION QUALITY:22265}  Affect:  {BHH AFFECT:22266}  Cognitive:  {BHH COGNITIVE:22267}  Insight: {BHH Insight2:20797}  Engagement in Group:  {BHH ENGAGEMENT IN GROUP:22268}  Modes of Intervention:  {BHH MODES OF INTERVENTION:22269}  Additional Comments:  ***  Jabier Deese Lashawn Killian Ress 05/21/2022, 9:40 AM  

## 2022-05-21 NOTE — Group Note (Signed)
[facility] LCSW Group Therapy Note   Group Date: 05/21/2022 Start Time: 1100 End Time: 1145   Type of Therapy/Topic:  Group Therapy:  Emotion Regulation  Participation Level:  Active   Mood:  Description of Group:    The purpose of this group is to assist patients in learning to regulate negative emotions and experience positive emotions. Patients will be guided to discuss ways in which they have been vulnerable to their negative emotions. These vulnerabilities will be juxtaposed with experiences of positive emotions or situations, and patients challenged to use positive emotions to combat negative ones. Special emphasis will be placed on coping with negative emotions in conflict situations, and patients will process healthy conflict resolution skills.  Therapeutic Goals: Patient will identify two positive emotions or experiences to reflect on in order to balance out negative emotions:  Patient will label two or more emotions that they find the most difficult to experience:  Patient will be able to demonstrate positive conflict resolution skills through discussion or role plays:   Summary of Patient Progress:  Pt came to group and accepted the provided worksheet. Pt engaged in open discussion about regulating emotions. Patient was able to share positive insight on what he would do or say when providing different situational scenarios. Pt remained in group the entire time.        Therapeutic Modalities:   Cognitive Behavioral Therapy Feelings Identification Dialectical Behavioral Therapy   Kenzee Bassin S Blain Hunsucker, LCSW

## 2022-05-22 LAB — GLUCOSE, CAPILLARY
Glucose-Capillary: 127 mg/dL — ABNORMAL HIGH (ref 70–99)
Glucose-Capillary: 167 mg/dL — ABNORMAL HIGH (ref 70–99)

## 2022-05-22 LAB — POTASSIUM: Potassium: 3.6 mmol/L (ref 3.5–5.1)

## 2022-05-22 MED ORDER — AMLODIPINE BESYLATE 5 MG PO TABS
5.0000 mg | ORAL_TABLET | Freq: Every day | ORAL | Status: DC
  Administered 2022-05-22 – 2022-05-24 (×3): 5 mg via ORAL
  Filled 2022-05-22 (×6): qty 1

## 2022-05-22 MED ORDER — ARIPIPRAZOLE 5 MG PO TABS
5.0000 mg | ORAL_TABLET | Freq: Every day | ORAL | Status: DC
  Administered 2022-05-23 – 2022-05-26 (×4): 5 mg via ORAL
  Filled 2022-05-22 (×6): qty 1

## 2022-05-22 MED ORDER — CLONIDINE HCL 0.1 MG PO TABS
0.1000 mg | ORAL_TABLET | Freq: Two times a day (BID) | ORAL | Status: DC | PRN
  Administered 2022-05-22 – 2022-05-26 (×3): 0.1 mg via ORAL
  Filled 2022-05-22 (×4): qty 1

## 2022-05-22 NOTE — BHH Group Notes (Signed)
Adult Psychoeducational Group Note  Date:  05/22/2022 Time:  9:26 PM  Group Topic/Focus:  Recovery Goals:   The focus of this group is to identify appropriate goals for recovery and establish a plan to achieve them.  Participation Level:  Active  Participation Quality:  Sharing  Affect:  Appropriate  Cognitive:  Appropriate  Insight: Appropriate  Engagement in Group:  Engaged  Modes of Intervention:  Discussion  Additional Comments:    Narely Nobles A Raymond [employer] 05/22/2022, 9:26 PM

## 2022-05-22 NOTE — Group Note (Unsigned)
Date:  05/22/2022 Time:  12:07 PM  Group Topic/Focus:  Goals Group:   The focus of this group is to help patients establish daily goals to achieve during treatment and discuss how the patient can incorporate goal setting into their daily lives to aide in recovery. Orientation:   The focus of this group is to educate the patient on the purpose and policies of crisis stabilization and provide a format to answer questions about their admission.  The group details unit policies and expectations of patients while admitted.     Participation Level:  {BHH PARTICIPATION LEVEL:22264}  Participation Quality:  {BHH PARTICIPATION QUALITY:22265}  Affect:  {BHH AFFECT:22266}  Cognitive:  {BHH COGNITIVE:22267}  Insight: {BHH Insight2:20797}  Engagement in Group:  {BHH ENGAGEMENT IN GROUP:22268}  Modes of Intervention:  {BHH MODES OF INTERVENTION:22269}  Additional Comments:  ***  Lativia Velie Ramsay 05/22/2022, 12:07 PM  

## 2022-05-22 NOTE — Group Note (Signed)
Recreation Therapy Group Note   Group Topic:Team Building  Group Date: 05/22/2022 Start Time: 0933 End Time: 1002 Facilitators: Bence Trapp-McCall, LRT,CTRS Location: [address] Dayroom   Goal Area(s) Addresses:  Patient will effectively work with peer towards shared goal.  Patient will identify skills used to make activity successful.  Patient will share challenges and verbalize solution-driven approaches used. Patient will identify how skills used during activity can be used to reach post d/c goals.    Group Description: [employer]. Patients were provided the following materials: 5 drinking straws, 5 rubber bands, 5 paper clips, 2 index cards and 2 drinking cups. Using the provided materials patients were asked to build a launching mechanism to launch a ping pong ball across the room, approximately 10 feet. Patients were divided into teams of 3-5. Instructions required all materials be incorporated into the device, functionality of items left to the peer group's discretion.   Affect/Mood: Appropriate   Participation Level: Engaged   Participation Quality: Independent   Behavior: Appropriate   Speech/Thought Process: Focused   Insight: Good   Judgement: Good   Modes of Intervention: STEM Activity   Patient Response to Interventions:  Engaged   Education Outcome:  In group clarification offered    Clinical Observations/Individualized Feedback: Pt attended and participated in group.     Plan: Continue to engage patient in RT group sessions 2-3x/week.   Masako Overall-McCall, LRT,CTRS 05/22/2022 12:32 PM

## 2022-05-22 NOTE — Progress Notes (Signed)
[facility] MD Progress Note  05/22/2022 2:15 PM Janice Vaughan  MRN:  [id number]   Subjective:   The patient is a 52 year old female with a past psychiatric history of major depressive disorder, diagnosed by her outpatient psychiatric provider.  She presented voluntarily to the [facility] behavioral urgent care for suicidal thoughts.  Patient is admitted on a voluntary basis to the [facility] behavioral hospital.   Per nursing report, the patient has exhibited appropriate behavior.  On interview 5/2, the patient reports doing well aside from poor sleep last night.  She feels this may be related to the Abilify.  She requested this medication be changed to morning dosing.  This is agreed upon and the scheduling has been changed.  The patient reports unchanged levels of anxiety and sadness.  She reports appropriate appetite.  She denies experiencing any suicidal thoughts.  She reports having a great experience in group last night, when everyone clapped for her after she shared her story.  Becomes appropriately tearful after describing this moment.    Principal Problem: MDD (major depressive disorder), recurrent severe, without psychosis (HCC) Diagnosis: Principal Problem:   MDD (major depressive disorder), recurrent severe, without psychosis (HCC) Active Problems:   Suicidal ideations   Total Time spent with patient: 20 min  Past Psychiatric History: as per H and P  Past Medical History:  Past Medical History:  Diagnosis Date   Anxiety    Cellulitis of breast    DM (diabetes mellitus) (HCC)    GERD (gastroesophageal reflux disease)    History of malignant neoplasm of breast    History of psychological trauma    Hypertension    Lymphedema    Pruritic rash     Past Surgical History:  Procedure Laterality Date   KNEE SURGERY Left 02/05/2012   Family History:  Family History  Problem Relation Age of Onset   Hypertension Mother    Diabetes Mother    Kidney disease Mother     Schizophrenia Son    Bipolar disorder Son    Family Psychiatric  History: as per H and P Social History:  Social History   Substance and Sexual Activity  Alcohol Use Yes   Alcohol/week: 18.0 standard drinks of alcohol   Types: 18 Cans of beer per week   Comment: about 18 cans of beer per week      Social History   Substance and Sexual Activity  Drug Use No    Social History   Socioeconomic History   Marital status: Single    Spouse name: Not on file   Number of children: Not on file   Years of education: Not on file   Highest education level: Not on file  Occupational History   Not on file  Tobacco Use   Smoking status: Never   Smokeless tobacco: Never  Vaping Use   Vaping Use: Not on file  Substance and Sexual Activity   Alcohol use: Yes    Alcohol/week: 18.0 standard drinks of alcohol    Types: 18 Cans of beer per week    Comment: about 18 cans of beer per week    Drug use: No   Sexual activity: Not on file  Other Topics Concern   Not on file  Social History Narrative   Not on file   Social Determinants of Health   Financial Resource Strain: Not on file  Food Insecurity: No Food Insecurity (05/19/2022)   Hunger Vital Sign    Worried About Running Out of  Food in the Last Year: Never true    Ran Out of Food in the Last Year: Never true  Transportation Needs: No Transportation Needs (05/19/2022)   PRAPARE - Administrator, Civil Service (Medical): No    Lack of Transportation (Non-Medical): No  Physical Activity: Not on file  Stress: Not on file  Social Connections: Not on file   Additional Social History:                         Sleep: fair  Appetite: fair  Current Medications: Current Facility-Administered Medications  Medication Dose Route Frequency Provider Last Rate Last Admin   acetaminophen (TYLENOL) tablet 650 mg  650 mg Oral Q6H PRN Bing Neighbors, NP       alum & mag hydroxide-simeth (MAALOX/MYLANTA) 200-200-20  MG/5ML suspension 30 mL  30 mL Oral Q4H PRN Bing Neighbors, NP       Melene Muller ON 05/23/2022] ARIPiprazole (ABILIFY) tablet 5 mg  5 mg Oral Daily Carlyn Reichert, MD       haloperidol (HALDOL) tablet 5 mg  5 mg Oral Q4H PRN Abbott Pao, Nadir, MD       And   LORazepam (ATIVAN) tablet 2 mg  2 mg Oral Q4H PRN Abbott Pao, Nadir, MD       And   benztropine (COGENTIN) tablet 1 mg  1 mg Oral Q4H PRN Abbott Pao, Nadir, MD       haloperidol lactate (HALDOL) injection 5 mg  5 mg Intramuscular Q4H PRN Abbott Pao, Nadir, MD       And   LORazepam (ATIVAN) injection 2 mg  2 mg Intramuscular Q4H PRN Abbott Pao, Nadir, MD       And   benztropine mesylate (COGENTIN) injection 1 mg  1 mg Intramuscular Q4H PRN Abbott Pao, Nadir, MD       hydrochlorothiazide (HYDRODIURIL) tablet 25 mg  25 mg Oral Daily Bing Neighbors, NP   25 mg at 05/22/22 1610   hydrOXYzine (ATARAX) tablet 25 mg  25 mg Oral TID PRN Bing Neighbors, NP   25 mg at 05/20/22 2133   loratadine (CLARITIN) tablet 10 mg  10 mg Oral Daily Bing Neighbors, NP   10 mg at 05/22/22 9604   magnesium hydroxide (MILK OF MAGNESIA) suspension 30 mL  30 mL Oral Daily PRN Bing Neighbors, NP       metFORMIN (GLUCOPHAGE) tablet 850 mg  850 mg Oral Q breakfast Bing Neighbors, NP   850 mg at 05/22/22 0827   naproxen (NAPROSYN) tablet 500 mg  500 mg Oral BID PRN Bing Neighbors, NP       pantoprazole (PROTONIX) EC tablet 40 mg  40 mg Oral Daily Carlyn Reichert, MD   40 mg at 05/22/22 0827   potassium chloride SA (KLOR-CON M) CR tablet 20 mEq  20 mEq Oral Daily Bing Neighbors, NP   20 mEq at 05/22/22 5409   rosuvastatin (CRESTOR) tablet 10 mg  10 mg Oral Daily Bing Neighbors, NP   10 mg at 05/22/22 8119   tamoxifen (NOLVADEX) tablet 20 mg  20 mg Oral Daily Bing Neighbors, NP   20 mg at 05/22/22 1478   traZODone (DESYREL) tablet 150 mg  150 mg Oral QHS Bing Neighbors, NP   150 mg at 05/21/22 2105   venlafaxine XR (EFFEXOR-XR) 24 hr capsule 225 mg  225 mg Oral  Q breakfast Bing Neighbors, NP  225 mg at 05/22/22 7829    Lab Results:  Results for orders placed or performed during the hospital encounter of 05/19/22 (from the past 48 hour(s))  Glucose, capillary     Status: Abnormal   Collection Time: 05/20/22  5:12 PM  Result Value Ref Range   Glucose-Capillary 120 (H) 70 - 99 mg/dL    Comment: Glucose reference range applies only to samples taken after fasting for at least 8 hours.  Glucose, capillary     Status: Abnormal   Collection Time: 05/20/22  9:00 PM  Result Value Ref Range   Glucose-Capillary 131 (H) 70 - 99 mg/dL    Comment: Glucose reference range applies only to samples taken after fasting for at least 8 hours.  Glucose, capillary     Status: Abnormal   Collection Time: 05/21/22  5:56 AM  Result Value Ref Range   Glucose-Capillary 132 (H) 70 - 99 mg/dL    Comment: Glucose reference range applies only to samples taken after fasting for at least 8 hours.  Glucose, capillary     Status: Abnormal   Collection Time: 05/21/22 12:13 PM  Result Value Ref Range   Glucose-Capillary 139 (H) 70 - 99 mg/dL    Comment: Glucose reference range applies only to samples taken after fasting for at least 8 hours.  Glucose, capillary     Status: Abnormal   Collection Time: 05/21/22  5:29 PM  Result Value Ref Range   Glucose-Capillary 111 (H) 70 - 99 mg/dL    Comment: Glucose reference range applies only to samples taken after fasting for at least 8 hours.  Glucose, capillary     Status: Abnormal   Collection Time: 05/21/22  7:40 PM  Result Value Ref Range   Glucose-Capillary 109 (H) 70 - 99 mg/dL    Comment: Glucose reference range applies only to samples taken after fasting for at least 8 hours.  Glucose, capillary     Status: Abnormal   Collection Time: 05/22/22  5:45 AM  Result Value Ref Range   Glucose-Capillary 127 (H) 70 - 99 mg/dL    Comment: Glucose reference range applies only to samples taken after fasting for at least 8 hours.    Comment 1 Notify RN    Comment 2 Document in Chart   Potassium     Status: None   Collection Time: 05/22/22  6:37 AM  Result Value Ref Range   Potassium 3.6 3.5 - 5.1 mmol/L    Comment: Performed at [facility], [address] [place], [place] [address]    Blood Alcohol level:  Lab Results  Component Value Date   ETH <10 05/18/2022    Metabolic Disorder Labs: Lab Results  Component Value Date   HGBA1C 7.1 (H) 05/18/2022   MPG 157.07 05/18/2022   No results found for: "PROLACTIN" Lab Results  Component Value Date   CHOL 255 (H) 05/18/2022   TRIG 156 (H) 05/18/2022   HDL 54 05/18/2022   CHOLHDL 4.7 05/18/2022   VLDL 31 05/18/2022   LDLCALC 170 (H) 05/18/2022    Physical Findings:   Psychiatric Specialty Exam: Physical Exam Constitutional:      Appearance: the patient is not toxic-appearing.  Pulmonary:     Effort: Pulmonary effort is normal.  Neurological:     General: No focal deficit present.     Mental Status: the patient is alert and oriented to person, place, and time.   Review of Systems  Respiratory:  Negative for shortness  of breath.   Cardiovascular:  Negative for chest pain.  Gastrointestinal:  Negative for abdominal pain, constipation, diarrhea, nausea and vomiting.  Neurological:  Negative for headaches.      BP (!) 147/101 (BP Location: Left Arm)   Pulse 80   Temp 98.1 F (36.7 C) (Oral)   Resp 18   Ht 5\' 4"  (1.626 m)   Wt 72 kg   SpO2 100%   BMI 27.26 kg/m   General Appearance: Fairly Groomed  Eye Contact:  Good  Speech:  Clear and Coherent  Volume:  Normal  Mood:  "alright"  Affect:  euthymic, tearful and emotional appropriately  Thought Process:  Coherent  Orientation:  Full (Time, Place, and Person)  Thought Content: Logical   Suicidal Thoughts:  No  Homicidal Thoughts:  No  Memory:  Immediate;   Good  Judgement:  fair  Insight:  fair  Psychomotor Activity:  Normal  Concentration:  Concentration: Good   Recall:  Good  Fund of Knowledge: Good  Language: Good  Akathisia:  No  Handed:  not assessed  AIMS (if indicated): not done  Assets:  Communication Skills Desire for Improvement Financial Resources/Insurance Housing Leisure Time Physical Health  ADL's:  Intact  Cognition: WNL  Sleep:  Fair      Treatment Plan Summary: Daily contact with patient to assess and evaluate symptoms and progress in treatment and Medication management  Diagnoses / Active Problems: Major depressive disorder Generalized anxiety disorder     PLAN: Safety and Monitoring:             --  Voluntary admission to inpatient psychiatric unit for safety, stabilization and treatment - One-to-one monitoring was discontinued             -- Daily contact with patient to assess and evaluate symptoms and progress in treatment             -- Patient's case to be discussed in multi-disciplinary team meeting             -- Observation Level : q15 minute checks             -- Vital signs:  q12 hours             -- Precautions: suicide, elopement, and assault   2. Psychiatric Diagnoses and Treatment:  -Continue home Effexor 225 mg daily - Continue home trazodone 150 mg nightly - Abilify 5 mg daily was added for augmentation of major depressive disorder treatment and treatment of lability and irritability, dose timing changed to morning                3. Medical Issues Being Addressed:              Labs review, notable for A1c of 7.1, CBGs unremarkable, mild hypokalemia of 3.2, otherwise unremarkable.  Potassium replaced and home potassium restarted.  Potassium normal on recheck. - Continue home metformin and tamoxifen and rosuvastatin - No CIWA protocol because the patient denies significant alcohol use recently, denies using her Xanax regularly, and no objective signs of withdrawal presently.             Tobacco Use Disorder             -- Nicotine patch 21mg /24 hours ordered             -- Smoking cessation  encouraged   4. Discharge Planning:              --  Social work and case management to assist with discharge planning and identification of hospital follow-up needs prior to discharge             -- Estimated LOS: 5-7 days             -- Discharge Concerns: Need to establish a safety plan; Medication compliance and effectiveness             -- Discharge Goals: Return home with outpatient referrals for mental health follow-up including medication management/psychotherapy    Carlyn Reichert, MD PGY-2

## 2022-05-22 NOTE — Progress Notes (Signed)
   05/22/22 1800  Psych Admission Type (Psych Patients Only)  Admission Status Voluntary  Psychosocial Assessment  Patient Complaints Anxiety;Crying spells  Eye Contact Fair  Facial Expression Animated  Affect Appropriate to circumstance  Speech Logical/coherent  Interaction Assertive  Motor Activity Slow  Appearance/Hygiene Unremarkable  Behavior Characteristics Cooperative;Appropriate to situation;Anxious  Mood Depressed  Thought Process  Coherency WDL  Content WDL  Delusions None reported or observed  Perception WDL  Hallucination None reported or observed  Judgment Poor  Confusion None  Danger to Self  Current suicidal ideation? Denies  Agreement Not to Harm Self Yes  Description of Agreement verbal  Danger to Others  Danger to Others None reported or observed

## 2022-05-23 LAB — GLUCOSE, CAPILLARY
Glucose-Capillary: 115 mg/dL — ABNORMAL HIGH (ref 70–99)
Glucose-Capillary: 81 mg/dL (ref 70–99)
Glucose-Capillary: 142 mg/dL — ABNORMAL HIGH (ref 70–99)
Glucose-Capillary: 149 mg/dL — ABNORMAL HIGH (ref 70–99)

## 2022-05-23 MED ORDER — TRAZODONE HCL 100 MG PO TABS
200.0000 mg | ORAL_TABLET | Freq: Every day | ORAL | Status: DC
  Administered 2022-05-23: 200 mg via ORAL
  Filled 2022-05-23 (×3): qty 2

## 2022-05-23 NOTE — BHH Group Notes (Signed)
Adult Psychoeducational Group Note  Date:  05/23/2022 Time:  9:29 AM  Group Topic/Focus:  Orientation:   The focus of this group is to educate the patient on the purpose and policies of crisis stabilization and provide a format to answer questions about their admission.  The group details unit policies and expectations of patients while admitted.  Participation Level:  Active  Participation Quality:  Appropriate and Sharing  Affect:  Appropriate  Cognitive:  Appropriate  Insight: Appropriate  Engagement in Group:  Engaged  Modes of Intervention:  Orientation  Additional Comments:  Pt identified goal for today as to keep smiling, get herself stronger and to open up more to certain people.  Janice Vaughan [name] 05/23/2022, 9:29 AM

## 2022-05-23 NOTE — Progress Notes (Signed)
EKG results placed on the outside of patient's shadow chart  Normal sinus rhythm Normal ECG  QT/Qtc-Baz  406/468 ms

## 2022-05-23 NOTE — Progress Notes (Signed)
   05/23/22 2000  Psych Admission Type (Psych Patients Only)  Admission Status Voluntary  Psychosocial Assessment  Patient Complaints Anxiety;Depression  Eye Contact Fair  Facial Expression Animated  Affect Appropriate to circumstance  Speech Logical/coherent  Interaction Assertive  Motor Activity Other (Comment) (wnl)  Appearance/Hygiene Unremarkable  Behavior Characteristics Appropriate to situation  Mood Pleasant  Thought Process  Coherency WDL  Content WDL  Delusions None reported or observed  Perception WDL  Hallucination None reported or observed  Judgment Impaired  Confusion None  Danger to Self  Current suicidal ideation? Denies   Alert/oriented. Makes needs/concerns known to staff. Pleasant cooperative with staff. Denies SI/HI/A/V hallucinations. Patient states went to group. Will encourage continued compliance and progression towards goals. Verbally contracted for safety. Will continue to monitor.

## 2022-05-23 NOTE — Progress Notes (Signed)
   05/22/22 2025  Psych Admission Type (Psych Patients Only)  Admission Status Voluntary  Psychosocial Assessment  Patient Complaints Depression  Eye Contact Fair  Facial Expression Animated  Affect Appropriate to circumstance  Speech Logical/coherent  Interaction Assertive  Motor Activity Other (Comment)  Appearance/Hygiene Unremarkable  Behavior Characteristics Cooperative;Appropriate to situation  Mood Depressed;Sad  Thought Process  Coherency WDL  Content WDL  Delusions None reported or observed  Perception WDL  Hallucination None reported or observed  Judgment Poor  Confusion None  Danger to Self  Current suicidal ideation? Denies  Agreement Not to Harm Self Yes  Description of Agreement pt verbally contracted for safety.  Danger to Others  Danger to Others None reported or observed

## 2022-05-23 NOTE — Plan of Care (Signed)
  Problem: Safety: Goal: Periods of time without injury will increase Outcome: Progressing   

## 2022-05-23 NOTE — BHH Group Notes (Signed)
Adult Psychoeducational Group Note  Date:  05/23/2022 Time:  2:21 PM  Group Topic/Focus:  Emotional Education:   The focus of this group is to discuss what feelings/emotions are, and how they are experienced.  The group focused on the process of reframing thought process from negative to positive.  Participation Level:  Active  Participation Quality:  Appropriate and Sharing  Affect:  Appropriate  Cognitive:  Appropriate  Insight: Appropriate  Engagement in Group:  Engaged  Modes of Intervention:  Discussion  Additional Comments:  Pt identified the relationship with her family and being made to feel she is not good enough is what makes her feel sad.  The envy her family shows towards her makes her feel bad as well.  Pt has learned to confront these feelings by meeting new people, taking compliments given and not letting the envy her family take control of her feelings.  [name] A Janice Vaughan 05/23/2022, 2:21 PM

## 2022-05-23 NOTE — B[facility] Group Notes (Signed)
B[facility] Group Notes:  (Nursing/MHT/Case Management/Adjunct)  Date:  05/23/2022  Time:  9:06 PM  Type of Therapy:   Wrap -up group  Participation Level:  Active  Participation Quality:  Appropriate  Affect:  Appropriate  Cognitive:  Appropriate  Insight:  Appropriate  Engagement in Group:  Engaged  Modes of Intervention:  Education  Summary of Progress/Problems: Goal to work on relationship with family. Day 9/10. Participated in an emotional skills exercise.   Noah Delaine 05/23/2022, 9:06 PM

## 2022-05-23 NOTE — Progress Notes (Addendum)
D. Pt presented animated,smiling and joking, friendly upon initial approach this am.Pt reported that she slept 'fair' last night. Pt described her appetite as 'fair', energy as 'normal', and concentration 'good'. Per pt's self inventory, pt rated her depression,hopelessness and anxiety an 8/0/6, respectively.  Pt currently denies SI/HI and AVH  A. Labs and vitals monitored. Pt given and educated on medications. Pt supported emotionally and encouraged to express concerns and ask questions.   R. Pt remains safe with 15 minute checks. Will continue POC.    05/23/22 1100  Psych Admission Type (Psych Patients Only)  Admission Status Voluntary  Psychosocial Assessment  Patient Complaints Anxiety;Depression  Eye Contact Fair  Facial Expression Animated  Affect Appropriate to circumstance  Speech Logical/coherent  Interaction Assertive  Motor Activity Other (Comment) (steady gait)  Appearance/Hygiene Unremarkable  Behavior Characteristics Appropriate to situation;Cooperative  Mood Pleasant  Thought Process  Coherency WDL  Content WDL  Delusions None reported or observed  Perception WDL  Hallucination None reported or observed  Judgment Poor  Confusion None  Danger to Self  Current suicidal ideation? Denies  Self-Injurious Behavior No self-injurious ideation or behavior indicators observed or expressed   Agreement Not to Harm Self Yes  Description of Agreement verbal contract for safety

## 2022-05-23 NOTE — Progress Notes (Signed)
[facility] MD Progress Note  05/23/2022 11:30 AM Nolie Kimak  MRN:  [id number]   Reason for Admission:  The patient is a 52 year old female with a past psychiatric history of major depressive disorder, diagnosed by her outpatient psychiatric provider.  She presented voluntarily to the [facility] behavioral urgent care for suicidal thoughts.  Patient is admitted on a voluntary basis to the [facility] behavioral hospital.   The patient is currently on Hospital Day 4.   Chart Review from last 24 hours:  The patient's chart was reviewed and nursing notes were reviewed. The patient's case was discussed in multidisciplinary team meeting. Per [facility] patient is compliant with medications of daily, no as needed medication given for agitation or aggression, clonidine was given as needed for elevated blood pressure 5/3, Atarax was given as needed for anxiety 5/1  Information Obtained Today During Patient Interview: The patient was seen and evaluated on the unit. On assessment today the patient reports attended groups yesterday able to interact in the milieu fairly with no problems, compliant with medications with no side effects, reports interrupted sleep last night, discussed with patient titrating trazodone dose, she agrees.  She denies passive or active SI intention or plan, denies HI or AVH.  She reports phone call from her son yesterday which she describes did not go well related to conflict in relationship from previous incidents, discussed with patient need for individual counseling after discharge to help address social stressors and coping skills, she agrees.  She denies symptoms consistent with mania or hypomania.  She reports fair appetite.   Sleep  Sleep: Interrupted sleep reported also reports difficulty falling asleep  Principal Problem: MDD (major depressive disorder), recurrent severe, without psychosis (HCC) Diagnosis: Principal Problem:   MDD (major depressive disorder), recurrent severe, without  psychosis (HCC) Active Problems:   Suicidal ideations    Past Psychiatric History: See H&P  Past Medical History:  Past Medical History:  Diagnosis Date   Anxiety    Cellulitis of breast    DM (diabetes mellitus) (HCC)    GERD (gastroesophageal reflux disease)    History of malignant neoplasm of breast    History of psychological trauma    Hypertension    Lymphedema    Pruritic rash     Past Surgical History:  Procedure Laterality Date   KNEE SURGERY Left 02/05/2012   Family History:  Family History  Problem Relation Age of Onset   Hypertension Mother    Diabetes Mother    Kidney disease Mother    Schizophrenia Son    Bipolar disorder Son    Family Psychiatric  History: See H&P Social History: See H&P  Current Medications: Current Facility-Administered Medications  Medication Dose Route Frequency Provider Last Rate Last Admin   acetaminophen (TYLENOL) tablet 650 mg  650 mg Oral Q6H PRN Bing Neighbors, NP       alum & mag hydroxide-simeth (MAALOX/MYLANTA) 200-200-20 MG/5ML suspension 30 mL  30 mL Oral Q4H PRN Bing Neighbors, NP       amLODipine (NORVASC) tablet 5 mg  5 mg Oral Daily Nkwenti, Doris, NP   5 mg at 05/23/22 0806   ARIPiprazole (ABILIFY) tablet 5 mg  5 mg Oral Daily Carlyn Reichert, MD   5 mg at 05/23/22 4098   haloperidol (HALDOL) tablet 5 mg  5 mg Oral Q4H PRN Abbott Pao, Sourish Allender, MD       And   LORazepam (ATIVAN) tablet 2 mg  2 mg Oral Q4H PRN Sarita Bottom, MD  And   benztropine (COGENTIN) tablet 1 mg  1 mg Oral Q4H PRN Abbott Pao, Alynna Hargrove, MD       haloperidol lactate (HALDOL) injection 5 mg  5 mg Intramuscular Q4H PRN Abbott Pao, Davey Bergsma, MD       And   LORazepam (ATIVAN) injection 2 mg  2 mg Intramuscular Q4H PRN Abbott Pao, Georgena Weisheit, MD       And   benztropine mesylate (COGENTIN) injection 1 mg  1 mg Intramuscular Q4H PRN Abbott Pao, Kennth Vanbenschoten, MD       cloNIDine (CATAPRES) tablet 0.1 mg  0.1 mg Oral BID PRN Starleen Blue, NP   0.1 mg at 05/22/22 2209    hydrochlorothiazide (HYDRODIURIL) tablet 25 mg  25 mg Oral Daily Bing Neighbors, NP   25 mg at 05/23/22 1610   hydrOXYzine (ATARAX) tablet 25 mg  25 mg Oral TID PRN Bing Neighbors, NP   25 mg at 05/20/22 2133   loratadine (CLARITIN) tablet 10 mg  10 mg Oral Daily Bing Neighbors, NP   10 mg at 05/23/22 0806   magnesium hydroxide (MILK OF MAGNESIA) suspension 30 mL  30 mL Oral Daily PRN Bing Neighbors, NP       metFORMIN (GLUCOPHAGE) tablet 850 mg  850 mg Oral Q breakfast Bing Neighbors, NP   850 mg at 05/23/22 0806   naproxen (NAPROSYN) tablet 500 mg  500 mg Oral BID PRN Bing Neighbors, NP       pantoprazole (PROTONIX) EC tablet 40 mg  40 mg Oral Daily Carlyn Reichert, MD   40 mg at 05/23/22 9604   potassium chloride SA (KLOR-CON M) CR tablet 20 mEq  20 mEq Oral Daily Bing Neighbors, NP   20 mEq at 05/23/22 0806   rosuvastatin (CRESTOR) tablet 10 mg  10 mg Oral Daily Bing Neighbors, NP   10 mg at 05/23/22 5409   tamoxifen (NOLVADEX) tablet 20 mg  20 mg Oral Daily Bing Neighbors, NP   20 mg at 05/23/22 8119   traZODone (DESYREL) tablet 150 mg  150 mg Oral QHS Bing Neighbors, NP   150 mg at 05/22/22 2126   venlafaxine XR (EFFEXOR-XR) 24 hr capsule 225 mg  225 mg Oral Q breakfast Bing Neighbors, NP   225 mg at 05/23/22 1478    Lab Results:  Results for orders placed or performed during the hospital encounter of 05/19/22 (from the past 48 hour(s))  Glucose, capillary     Status: Abnormal   Collection Time: 05/21/22 12:13 PM  Result Value Ref Range   Glucose-Capillary 139 (H) 70 - 99 mg/dL    Comment: Glucose reference range applies only to samples taken after fasting for at least 8 hours.  Glucose, capillary     Status: Abnormal   Collection Time: 05/21/22  5:29 PM  Result Value Ref Range   Glucose-Capillary 111 (H) 70 - 99 mg/dL    Comment: Glucose reference range applies only to samples taken after fasting for at least 8 hours.  Glucose, capillary      Status: Abnormal   Collection Time: 05/21/22  7:40 PM  Result Value Ref Range   Glucose-Capillary 109 (H) 70 - 99 mg/dL    Comment: Glucose reference range applies only to samples taken after fasting for at least 8 hours.  Glucose, capillary     Status: Abnormal   Collection Time: 05/22/22  5:45 AM  Result Value Ref Range   Glucose-Capillary 127 (H) 70 -  99 mg/dL    Comment: Glucose reference range applies only to samples taken after fasting for at least 8 hours.   Comment 1 Notify RN    Comment 2 Document in Chart   Potassium     Status: None   Collection Time: 05/22/22  6:37 AM  Result Value Ref Range   Potassium 3.6 3.5 - 5.1 mmol/L    Comment: Performed at [facility], [address], [place], [place] [address]  Glucose, capillary     Status: Abnormal   Collection Time: 05/22/22  7:52 PM  Result Value Ref Range   Glucose-Capillary 167 (H) 70 - 99 mg/dL    Comment: Glucose reference range applies only to samples taken after fasting for at least 8 hours.  Glucose, capillary     Status: Abnormal   Collection Time: 05/23/22  5:28 AM  Result Value Ref Range   Glucose-Capillary 115 (H) 70 - 99 mg/dL    Comment: Glucose reference range applies only to samples taken after fasting for at least 8 hours.   Comment 1 Notify RN     Blood Alcohol level:  Lab Results  Component Value Date   ETH <10 05/18/2022    Metabolic Disorder Labs: Lab Results  Component Value Date   HGBA1C 7.1 (H) 05/18/2022   MPG 157.07 05/18/2022   No results found for: "PROLACTIN" Lab Results  Component Value Date   CHOL 255 (H) 05/18/2022   TRIG 156 (H) 05/18/2022   HDL 54 05/18/2022   CHOLHDL 4.7 05/18/2022   VLDL 31 05/18/2022   LDLCALC 170 (H) 05/18/2022    Physical Findings: AIMS: Facial and Oral Movements Muscles of Facial Expression: None, normal Lips and Perioral Area: None, normal Jaw: None, normal Tongue: None, normal,Extremity Movements Upper (arms, wrists,  hands, fingers): None, normal Lower (legs, knees, ankles, toes): None, normal, Trunk Movements Neck, shoulders, hips: None, normal, Overall Severity Severity of abnormal movements (highest score from questions above): None, normal Incapacitation due to abnormal movements: None, normal Patient's awareness of abnormal movements (rate only patient's report): No Awareness, Dental Status Current problems with teeth and/or dentures?: No Does patient usually wear dentures?: No  CIWA:    COWS:     Musculoskeletal: Strength & Muscle Tone: within normal limits Gait & Station: normal Patient leans: N/A  Psychiatric Specialty Exam:  General Appearance: Appears at stated age, fairly dressed and groomed  Behavior: Pleasant, calm and cooperative  Psychomotor Activity: No psychomotor agitation but mild retardation noted  Eye Contact: Fair gait limited Speech: Occasional decreased amount but otherwise normal   Mood: Dysphoric but improved since admission Affect: Congruent, tearful talking about stressful relationship with her son  Thought Process: Linear and goal directed Descriptions of Associations: Intact, concrete Thought Content: Hallucinations: Denies AH, VH  Delusions: No paranoia  Suicidal Thoughts: Denies SI, intention, plan  Homicidal Thoughts: Denies HI, intention, plan   Alertness/Orientation: Alert and oriented  Insight: Improved, fair Judgment: Improved, fair  Memory: Intact Executive Functions  Concentration: Fair Attention Span: Fair Recall: [employer] of Knowledge: Fair   Assets  Assets: Manufacturing systems engineer; Desire for Improvement; Physical Health; Resilience    Physical Exam: Physical Exam ROS Blood pressure (!) 132/90, pulse 81, temperature 98.8 F (37.1 C), temperature source Oral, resp. rate 18, height 5\' 4"  (1.626 m), weight 72 kg, SpO2 100 %. Body mass index is 27.26 kg/m.   Treatment Plan Summary: Daily contact with patient to assess and  evaluate symptoms and progress in treatment  and Medication management  ASSESSMENT:  Diagnoses / Active Problems: Principal Problem: MDD (major depressive disorder), recurrent severe, without psychosis (HCC) Diagnosis: Principal Problem:   MDD (major depressive disorder), recurrent severe, without psychosis (HCC) Active Problems:   Suicidal ideations   PLAN: Safety and Monitoring:  -- Voluntary admission to inpatient psychiatric unit for safety, stabilization and treatment  -- Daily contact with patient to assess and evaluate symptoms and progress in treatment  -- Patient's case to be discussed in multi-disciplinary team meeting  -- Observation Level : q15 minute checks  -- Vital signs:  q12 hours  -- Precautions: suicide, elopement, and assault  2. Medications:   Continue Effexor 225 mg daily for depression and anxiety  Continue Abilify 5 mg daily to augment antidepressant effect and help with mood stabilization  Continue Glucophage 850 mg daily for diabetes, home medication  Continue Protonix 40 mg daily GERD, home medication  Continue potassium chloride 20 daily, home medications  Continue Crestor 10 mg daily for hyperlipidemia, home medication  Continue Claritin 10 mg daily for allergic rhinitis, home medication  Continue hydrochlorothiazide 25 mg daily for hypertension, home medication  Continue Norvasc 5 mg daily for hypertension, home medication  Titrate trazodone from 150-200 mg at bedtime to help with sleep  Continue Atarax 25 mg 3 times daily as needed for anxiety   The risks/benefits/side-effects/alternatives to this medication were discussed in detail with the patient and time was given for questions. The patient consents to medication trial.    -- Metabolic profile and EKG monitoring obtained while on an atypical antipsychotic (BMI: Lipid Panel: HbgA1c: QTc:)      3. Pertinent labs: notable for A1c of 7.1, CBGs unremarkable, mild hypokalemia of 3.2, otherwise  unremarkable.  Potassium replaced and home potassium restarted.  Potassium normal on recheck.  EKG 4/29 QTc 470     Lab ordered: Repeat EKG   4. Group and Therapy: -- Encouraged patient to participate in unit milieu and in scheduled group therapies       -- Short Term Goals: Ability to identify changes in lifestyle to reduce recurrence of condition will improve, Ability to verbalize feelings will improve, Ability to disclose and discuss suicidal ideas, Ability to demonstrate self-control will improve, and Ability to identify and develop effective coping behaviors will improve  -- Long Term Goals: Improvement in symptoms so as ready for discharge  6. Discharge Planning:   -- Social work and case management to assist with discharge planning and identification of hospital follow-up needs prior to discharge  -- Estimated LOS: 5-7 days  -- Discharge Concerns: Need to establish a safety plan; Medication compliance and effectiveness  -- Discharge Goals: Return home with outpatient referrals for mental health follow-up including medication management/psychotherapy      Total Time Spent in Direct Patient Care:  I personally spent 35 minutes on the unit in direct patient care. The direct patient care time included face-to-face time with the patient, reviewing the patient's chart, communicating with other professionals, and coordinating care. Greater than 50% of this time was spent in counseling or coordinating care with the patient regarding goals of hospitalization, psycho-education, and discharge planning needs.   Anzleigh Slaven Abbott Pao, MD 05/23/2022, 11:30 AM

## 2022-05-24 LAB — GLUCOSE, CAPILLARY
Glucose-Capillary: 120 mg/dL — ABNORMAL HIGH (ref 70–99)
Glucose-Capillary: 105 mg/dL — ABNORMAL HIGH (ref 70–99)

## 2022-05-24 MED ORDER — AMLODIPINE BESYLATE 10 MG PO TABS
10.0000 mg | ORAL_TABLET | Freq: Every day | ORAL | Status: DC
  Administered 2022-05-25 – 2022-05-26 (×2): 10 mg via ORAL
  Filled 2022-05-24 (×4): qty 1

## 2022-05-24 MED ORDER — TRAZODONE HCL 150 MG PO TABS
300.0000 mg | ORAL_TABLET | Freq: Every day | ORAL | Status: DC
  Administered 2022-05-24 – 2022-05-25 (×2): 300 mg via ORAL
  Filled 2022-05-24 (×6): qty 2

## 2022-05-24 NOTE — Progress Notes (Signed)
Adult Psychoeducational Group Note  Date:  05/24/2022 Time:  10:02 PM  Group Topic/Focus:  Wrap-Up Group:   The focus of this group is to help patients review their daily goal of treatment and discuss progress on daily workbooks.  Participation Level:  Active  Participation Quality:  Appropriate  Affect:  Appropriate  Cognitive:  Appropriate  Insight: Appropriate  Engagement in Group:  Engaged  Modes of Intervention:  Discussion and Support  Additional Comments:  Pt attended the evening group and responded to all discussion prompts from the Writer. Pt shared that today was a good day on the unit, the highlight of which was a surprise visit from her son. On the subject of goals for the coming week, Pt sought to learn more about her diagnosis and eventual discharge plan. Pt rated her day a 9 out of 10.  Christ Kick 05/24/2022, 10:02 PM

## 2022-05-24 NOTE — Group Note (Signed)
Date:  05/24/2022 Time:  4:22 PM  Group Topic/Focus:  Dimensions of Wellness:   The focus of this group is to introduce the topic of wellness and discuss the role each dimension of wellness plays in total health.    Participation Level:  Active  Participation Quality:  Appropriate  Affect:  Appropriate  Cognitive:  Appropriate  Insight: Appropriate  Engagement in Group:  Engaged  Modes of Intervention:  Activity and Discussion  Additional Comments:   Pt attended and participated in the Intellectual Wellness group.  Edmund Hilda Malini Flemings 05/24/2022, 4:22 PM

## 2022-05-24 NOTE — Group Note (Signed)
Date:  05/24/2022 Time:  9:49 AM  Group Topic/Focus:  Goals Group:   The focus of this group is to help patients establish daily goals to achieve during treatment and discuss how the patient can incorporate goal setting into their daily lives to aide in recovery. Orientation:   The focus of this group is to educate the patient on the purpose and policies of crisis stabilization and provide a format to answer questions about their admission.  The group details unit policies and expectations of patients while admitted.    Participation Level:  Active  Participation Quality:  Appropriate  Affect:  Appropriate  Cognitive:  Appropriate  Insight: Appropriate  Engagement in Group:  Engaged  Modes of Intervention:  Activity  Additional Comments:   Pt attended and participated in the Orientation/Goals group. Pt completed the goals activity.  Janice Vaughan 05/24/2022, 9:49 AM

## 2022-05-24 NOTE — Group Note (Signed)
Date:  05/24/2022 Time:  6:44 PM  Group Topic/Focus:  Dimensions of Wellness:   The focus of this group is to introduce the topic of wellness and discuss the role each dimension of wellness plays in total health.    Participation Level:  Active  Participation Quality:  Appropriate  Affect:  Appropriate  Cognitive:  Appropriate  Insight: Appropriate  Engagement in Group:  Engaged  Modes of Intervention:  Activity and Discussion  Additional Comments:   Pt attended and participated in the Spiritual Wellness group.  Janice Vaughan 05/24/2022, 6:44 PM

## 2022-05-24 NOTE — Progress Notes (Addendum)
[facility] MD Progress Note  05/24/2022 11:21 AM Cing Kunda  MRN:  [id number]   Reason for Admission:  The patient is a 52 year old female with a past psychiatric history of major depressive disorder, diagnosed by her outpatient psychiatric provider.  She presented voluntarily to the [facility] behavioral urgent care for suicidal thoughts.  Patient is admitted on a voluntary basis to the [facility] behavioral hospital.   The patient is currently on Hospital Day 5.   Chart Review from last 24 hours:  The patient's chart was reviewed and nursing notes were reviewed. The patient's case was discussed in multidisciplinary team meeting. Per [facility] patient is compliant with medications of daily, no as needed medication given for agitation or aggression, clonidine was given as needed for elevated blood pressure 5/3, Atarax was given as needed for anxiety 5/1, blood pressure remains elevated despite current compliance with Norvasc and hydrochlorothiazide  Information Obtained Today During Patient Interview: The patient was seen and evaluated on the unit. On assessment today the patient reports attending groups and participating, interacting in the milieu.  Reports son visited yesterday and visit went very well, she presents pleasant and smiling but reports irritability this morning probably related to poor sleep and difficulty falling and staying asleep, discussed titrating trazodone to 300 mg at night for sleep, if not effective will consider switching to [facility], will follow.  She reports on and off depressed mood but improving, reports on and off anxiety and irritability probably related to poor sleep, discussed utilizing hydroxyzine 25 mg and if not effective will titrate to 50 mg, will follow.  She denies passive or active SI intention or plan, denies HI or AVH.   Sleep  Sleep: Interrupted sleep reported also reports difficulty falling asleep  Principal Problem: MDD (major depressive disorder), recurrent  severe, without psychosis (HCC) Diagnosis: Principal Problem:   MDD (major depressive disorder), recurrent severe, without psychosis (HCC) Active Problems:   Suicidal ideations    Past Psychiatric History: See H&P  Past Medical History:  Past Medical History:  Diagnosis Date   Anxiety    Cellulitis of breast    DM (diabetes mellitus) (HCC)    GERD (gastroesophageal reflux disease)    History of malignant neoplasm of breast    History of psychological trauma    Hypertension    Lymphedema    Pruritic rash     Past Surgical History:  Procedure Laterality Date   KNEE SURGERY Left 02/05/2012   Family History:  Family History  Problem Relation Age of Onset   Hypertension Mother    Diabetes Mother    Kidney disease Mother    Schizophrenia Son    Bipolar disorder Son    Family Psychiatric  History: See H&P Social History: See H&P  Current Medications: Current Facility-Administered Medications  Medication Dose Route Frequency Provider Last Rate Last Admin   acetaminophen (TYLENOL) tablet 650 mg  650 mg Oral Q6H PRN Bing Neighbors, NP       alum & mag hydroxide-simeth (MAALOX/MYLANTA) 200-200-20 MG/5ML suspension 30 mL  30 mL Oral Q4H PRN Bing Neighbors, NP       amLODipine (NORVASC) tablet 5 mg  5 mg Oral Daily Nkwenti, Doris, NP   5 mg at 05/24/22 0745   ARIPiprazole (ABILIFY) tablet 5 mg  5 mg Oral Daily Carlyn Reichert, MD   5 mg at 05/24/22 0744   haloperidol (HALDOL) tablet 5 mg  5 mg Oral Q4H PRN Sarita Bottom, MD  And   LORazepam (ATIVAN) tablet 2 mg  2 mg Oral Q4H PRN Abbott Pao, Alyia Lacerte, MD       And   benztropine (COGENTIN) tablet 1 mg  1 mg Oral Q4H PRN Abbott Pao, Yesica Kemler, MD       haloperidol lactate (HALDOL) injection 5 mg  5 mg Intramuscular Q4H PRN Abbott Pao, Jonathandavid Marlett, MD       And   LORazepam (ATIVAN) injection 2 mg  2 mg Intramuscular Q4H PRN Abbott Pao, Reba Hulett, MD       And   benztropine mesylate (COGENTIN) injection 1 mg  1 mg Intramuscular Q4H PRN Abbott Pao,  Rodman Recupero, MD       cloNIDine (CATAPRES) tablet 0.1 mg  0.1 mg Oral BID PRN Starleen Blue, NP   0.1 mg at 05/22/22 2209   hydrochlorothiazide (HYDRODIURIL) tablet 25 mg  25 mg Oral Daily Bing Neighbors, NP   25 mg at 05/24/22 0745   hydrOXYzine (ATARAX) tablet 25 mg  25 mg Oral TID PRN Bing Neighbors, NP   25 mg at 05/24/22 0834   loratadine (CLARITIN) tablet 10 mg  10 mg Oral Daily Bing Neighbors, NP   10 mg at 05/24/22 0745   magnesium hydroxide (MILK OF MAGNESIA) suspension 30 mL  30 mL Oral Daily PRN Bing Neighbors, NP   30 mL at 05/24/22 1610   metFORMIN (GLUCOPHAGE) tablet 850 mg  850 mg Oral Q breakfast Bing Neighbors, NP   850 mg at 05/24/22 0745   naproxen (NAPROSYN) tablet 500 mg  500 mg Oral BID PRN Bing Neighbors, NP       pantoprazole (PROTONIX) EC tablet 40 mg  40 mg Oral Daily Carlyn Reichert, MD   40 mg at 05/24/22 0745   potassium chloride SA (KLOR-CON M) CR tablet 20 mEq  20 mEq Oral Daily Bing Neighbors, NP   20 mEq at 05/24/22 0745   rosuvastatin (CRESTOR) tablet 10 mg  10 mg Oral Daily Bing Neighbors, NP   10 mg at 05/24/22 0745   tamoxifen (NOLVADEX) tablet 20 mg  20 mg Oral Daily Bing Neighbors, NP   20 mg at 05/24/22 0930   traZODone (DESYREL) tablet 300 mg  300 mg Oral QHS Abbott Pao, Julia Kulzer, MD       venlafaxine XR (EFFEXOR-XR) 24 hr capsule 225 mg  225 mg Oral Q breakfast Bing Neighbors, NP   225 mg at 05/24/22 0745    Lab Results:  Results for orders placed or performed during the hospital encounter of 05/19/22 (from the past 48 hour(s))  Glucose, capillary     Status: Abnormal   Collection Time: 05/22/22  7:52 PM  Result Value Ref Range   Glucose-Capillary 167 (H) 70 - 99 mg/dL    Comment: Glucose reference range applies only to samples taken after fasting for at least 8 hours.  Glucose, capillary     Status: Abnormal   Collection Time: 05/23/22  5:28 AM  Result Value Ref Range   Glucose-Capillary 115 (H) 70 - 99 mg/dL     Comment: Glucose reference range applies only to samples taken after fasting for at least 8 hours.   Comment 1 Notify RN   Glucose, capillary     Status: None   Collection Time: 05/23/22 11:47 AM  Result Value Ref Range   Glucose-Capillary 81 70 - 99 mg/dL    Comment: Glucose reference range applies only to samples taken after fasting for at least 8 hours.  Glucose, capillary     Status: Abnormal   Collection Time: 05/23/22  4:10 PM  Result Value Ref Range   Glucose-Capillary 142 (H) 70 - 99 mg/dL    Comment: Glucose reference range applies only to samples taken after fasting for at least 8 hours.  Glucose, capillary     Status: Abnormal   Collection Time: 05/23/22  8:38 PM  Result Value Ref Range   Glucose-Capillary 149 (H) 70 - 99 mg/dL    Comment: Glucose reference range applies only to samples taken after fasting for at least 8 hours.   Comment 1 Notify RN    Comment 2 Document in Chart   Glucose, capillary     Status: Abnormal   Collection Time: 05/24/22  5:51 AM  Result Value Ref Range   Glucose-Capillary 120 (H) 70 - 99 mg/dL    Comment: Glucose reference range applies only to samples taken after fasting for at least 8 hours.   Comment 1 Notify RN    Comment 2 Document in Chart     Blood Alcohol level:  Lab Results  Component Value Date   ETH <10 05/18/2022    Metabolic Disorder Labs: Lab Results  Component Value Date   HGBA1C 7.1 (H) 05/18/2022   MPG 157.07 05/18/2022   No results found for: "PROLACTIN" Lab Results  Component Value Date   CHOL 255 (H) 05/18/2022   TRIG 156 (H) 05/18/2022   HDL 54 05/18/2022   CHOLHDL 4.7 05/18/2022   VLDL 31 05/18/2022   LDLCALC 170 (H) 05/18/2022    Physical Findings: AIMS: Facial and Oral Movements Muscles of Facial Expression: None, normal Lips and Perioral Area: None, normal Jaw: None, normal Tongue: None, normal,Extremity Movements Upper (arms, wrists, hands, fingers): None, normal Lower (legs, knees, ankles,  toes): None, normal, Trunk Movements Neck, shoulders, hips: None, normal, Overall Severity Severity of abnormal movements (highest score from questions above): None, normal Incapacitation due to abnormal movements: None, normal Patient's awareness of abnormal movements (rate only patient's report): No Awareness, Dental Status Current problems with teeth and/or dentures?: No Does patient usually wear dentures?: No  CIWA:    COWS:     Musculoskeletal: Strength & Muscle Tone: within normal limits Gait & Station: normal Patient leans: N/A  Psychiatric Specialty Exam:  General Appearance: Appears at stated age, fairly dressed and groomed  Behavior: Pleasant, calm and cooperative  Psychomotor Activity: No psychomotor agitation but mild retardation noted  Eye Contact: Fair gait limited Speech: Occasional decreased amount but otherwise normal   Mood: Dysphoric but improved since admission Affect: Congruent, tearful talking about stressful relationship with her son  Thought Process: Linear and goal directed Descriptions of Associations: Intact, concrete Thought Content: Hallucinations: Denies AH, VH  Delusions: No paranoia  Suicidal Thoughts: Denies SI, intention, plan  Homicidal Thoughts: Denies HI, intention, plan   Alertness/Orientation: Alert and oriented  Insight: Improved, fair Judgment: Improved, fair  Memory: Intact Executive Functions  Concentration: Fair Attention Span: Fair Recall: [employer] of Knowledge: Fair   Assets  Assets: Manufacturing systems engineer; Desire for Improvement; Physical Health; Resilience    Physical Exam: Physical Exam Vitals and nursing note reviewed.    Review of Systems  All other systems reviewed and are negative.  Blood pressure (!) 153/101, pulse 73, temperature 98.1 F (36.7 C), temperature source Oral, resp. rate 14, height 5\' 4"  (1.626 m), weight 72 kg, SpO2 100 %. Body mass index is 27.26 kg/m.   Treatment Plan  Summary: Daily contact with patient  to assess and evaluate symptoms and progress in treatment and Medication management  ASSESSMENT:  Diagnoses / Active Problems: Principal Problem: MDD (major depressive disorder), recurrent severe, without psychosis (HCC) Diagnosis: Principal Problem:   MDD (major depressive disorder), recurrent severe, without psychosis (HCC) Active Problems:   Suicidal ideations   PLAN: Safety and Monitoring:  -- Voluntary admission to inpatient psychiatric unit for safety, stabilization and treatment  -- Daily contact with patient to assess and evaluate symptoms and progress in treatment  -- Patient's case to be discussed in multi-disciplinary team meeting  -- Observation Level : q15 minute checks  -- Vital signs:  q12 hours  -- Precautions: suicide, elopement, and assault  2. Medications:   Continue Effexor 225 mg daily for depression and anxiety  Continue Abilify 5 mg daily to augment antidepressant effect and help with mood stabilization  Continue Glucophage 850 mg daily for diabetes, home medication  Continue Protonix 40 mg daily GERD, home medication  Continue potassium chloride 20 daily, home medications  Continue Crestor 10 mg daily for hyperlipidemia, home medication  Continue Claritin 10 mg daily for allergic rhinitis, home medication  Continue hydrochlorothiazide 25 mg daily for hypertension, home medication  Titrate Norvasc from 5-10 mg daily for hypertension, home medication  Titrate trazodone from 200-300 mg at bedtime to help with sleep, if 300 mg of trazodone not effective for sleep we will consider switching to Lunesta.  Continue Atarax 25 mg 3 times daily as needed for anxiety   The risks/benefits/side-effects/alternatives to this medication were discussed in detail with the patient and time was given for questions. The patient consents to medication trial.    -- Metabolic profile and EKG monitoring obtained while on an atypical antipsychotic  (BMI: Lipid Panel: HbgA1c: QTc:)      3. Pertinent labs: notable for A1c of 7.1, CBGs unremarkable, mild hypokalemia of 3.2, otherwise unremarkable.  Potassium replaced and home potassium restarted.  Potassium normal on recheck.  EKG 4/29 QTc 470 EKG 5/4 normal sinus rhythm QTc 468/stable      Lab ordered: CMP 5/6   4. Group and Therapy: -- Encouraged patient to participate in unit milieu and in scheduled group therapies       -- Short Term Goals: Ability to identify changes in lifestyle to reduce recurrence of condition will improve, Ability to verbalize feelings will improve, Ability to disclose and discuss suicidal ideas, Ability to demonstrate self-control will improve, and Ability to identify and develop effective coping behaviors will improve  -- Long Term Goals: Improvement in symptoms so as ready for discharge  6. Discharge Planning:   -- Social work and case management to assist with discharge planning and identification of hospital follow-up needs prior to discharge  -- Estimated LOS: 5-7 days  -- Discharge Concerns: Need to establish a safety plan; Medication compliance and effectiveness  -- Discharge Goals: Return home with outpatient referrals for mental health follow-up including medication management/psychotherapy      Total Time Spent in Direct Patient Care:  I personally spent 35 minutes on the unit in direct patient care. The direct patient care time included face-to-face time with the patient, reviewing the patient's chart, communicating with other professionals, and coordinating care. Greater than 50% of this time was spent in counseling or coordinating care with the patient regarding goals of hospitalization, psycho-education, and discharge planning needs.   Kalilah Barua Abbott Pao, MD 05/24/2022, 11:21 AM

## 2022-05-24 NOTE — Progress Notes (Addendum)
D. Pt presented irritable upon initial approach at the med window this am, complained of poor sleep due to plumbing disturbance on the hall. Pt was very short with this nurse and stated, "I just want to sleep."  Pt later observed in the dayroom  attending morning group with peers. Pt apologized to this nurse for her irritability  Per pt's self inventory, pt rated her depression,hopelessness and anxiety a 7/0/7, respectively. Pt's stated goal today is "to work on patience by counting to 10". Pt  denied SI/HI and AVH and does not appear to be responding to internal stimuli.  A. Labs and vitals monitored. Pt given and educated on medications. Pt supported emotionally and encouraged to express concerns and ask questions.   R. Pt remains safe with 15 minute checks. Will continue POC.    05/24/22 1000  Psych Admission Type (Psych Patients Only)  Admission Status Voluntary  Psychosocial Assessment  Patient Complaints Sleep disturbance  Eye Contact Brief  Facial Expression Animated  Affect Irritable;Appropriate to circumstance  Speech Logical/coherent  Interaction Assertive  Motor Activity Other (Comment) (steady gait)  Appearance/Hygiene Unremarkable  Behavior Characteristics Irritable  Mood Irritable  Thought Process  Coherency WDL  Content WDL  Delusions None reported or observed  Perception WDL  Hallucination None reported or observed  Judgment Limited  Confusion None  Danger to Self  Current suicidal ideation? Denies  Agreement Not to Harm Self Yes  Description of Agreement verbal contract for safety  Danger to Others  Danger to Others None reported or observed

## 2022-05-24 NOTE — Group Note (Signed)
Date:  05/24/2022 Time:  6:16 PM  Group Topic/Focus:  Dimensions of Wellness:   The focus of this group is to introduce the topic of wellness and discuss the role each dimension of wellness plays in total health.    Participation Level:  Active  Participation Quality:  Appropriate  Affect:  Appropriate  Cognitive:  Appropriate  Insight: Appropriate  Engagement in Group:  Engaged  Modes of Intervention:  Activity and Discussion  Additional Comments:   Pt attended and participated in the Emotional Wellness group.  Janice Vaughan 05/24/2022, 6:16 PM

## 2022-05-25 ENCOUNTER — Encounter (HOSPITAL_COMMUNITY): Payer: Self-pay

## 2022-05-25 LAB — COMPREHENSIVE METABOLIC PANEL
ALT: 14 U/L (ref 0–44)
AST: 15 U/L (ref 15–41)
Albumin: 4.2 g/dL (ref 3.5–5.0)
Alkaline Phosphatase: 64 U/L (ref 38–126)
Anion gap: 11 (ref 5–15)
BUN: 15 mg/dL (ref 6–20)
CO2: 30 mmol/L (ref 22–32)
Calcium: 9.2 mg/dL (ref 8.9–10.3)
Chloride: 98 mmol/L (ref 98–111)
Creatinine, Ser: 1.01 mg/dL — ABNORMAL HIGH (ref 0.44–1.00)
GFR, Estimated: >60 mL/min (ref >60)
Glucose, Bld: 126 mg/dL — ABNORMAL HIGH (ref 70–99)
Potassium: 3.7 mmol/L (ref 3.5–5.1)
Sodium: 139 mmol/L (ref 135–145)
Total Bilirubin: 0.5 mg/dL (ref 0.3–1.2)
Total Protein: 7.9 g/dL (ref 6.5–8.1)

## 2022-05-25 NOTE — Plan of Care (Signed)
  Problem: Safety: Goal: Periods of time without injury will increase 05/25/2022 2105 by Knox Royalty, RN Outcome: Progressing 05/25/2022 2105 by Knox Royalty, RN Outcome: Progressing

## 2022-05-25 NOTE — Group Note (Signed)
Recreation Therapy Group Note   Group Topic:Problem Solving  Group Date: 05/25/2022 Start Time: 0932 End Time: 0956 Facilitators: Daaron Dimarco-McCall, LRT,CTRS Location: [address] Dayroom   Goal Area(s) Addresses:  Patient will effectively work with peer towards shared goal.  Patient will identify skills used to make activity successful.  Patient will identify how skills used during activity can be applied to reach post d/c goals.   Group Description: [employer]. In teams of 5-6, patients were given 12 craft pipe cleaners. Using the materials provided, patients were instructed to compete again the opposing team(s) to build the tallest free-standing structure from floor level. The activity was timed; difficulty increased by Clinical research associate as Production designer, theatre/television/film continued.  Systematically resources were removed with additional directions for example, placing one arm behind their back, working in silence, and shape stipulations. LRT facilitated post-activity discussion reviewing team processes and necessary communication skills involved in completion. Patients were encouraged to reflect how the skills utilized, or not utilized, in this activity can be incorporated to positively impact support systems post discharge.   Affect/Mood: Appropriate   Participation Level: Engaged   Participation Quality: Independent   Behavior: Appropriate   Speech/Thought Process: Focused   Insight: Good   Judgement: Good   Modes of Intervention: STEM Activity   Patient Response to Interventions:  Engaged   Education Outcome:  Acknowledges education   Clinical Observations/Individualized Feedback: Pt was bright and engaged in activity.  Pt worked well with peers in completing the project.  Pt expressed that she withdraws from others to protect herself.  Pt did admit needing to rethink how she deals with family be open to letting some of them in.    Plan: Continue to engage patient in RT group  sessions 2-3x/week.   Jay Kempe-McCall, LRT,CTRS 05/25/2022 11:43 AM

## 2022-05-25 NOTE — Progress Notes (Signed)
Janice Vaughan requested to meet with chaplain following group.  She was not aware of mental health diagnosis, but is grateful to be here getting support now.  Her son has manic episodes and there are other family members with mental health diagnoses.  She had cancer in 2022 and has had many stressors related to that.  She is trying to take one day at a time and wants to strengthen her faith.  She has joined a church and is hoping to be able to attend soon.  Chaplain provided spiritual support as well as reflective listening.  [address], Bcc Pager, [phone]

## 2022-05-25 NOTE — [facility] Group Notes (Signed)
Spirituality group facilitated by Kathleen Argue, BCC.  Group Description: Group focused on topic of hope. Patients participated in facilitated discussion around topic, connecting with one another around experiences and definitions for hope. Group members engaged with visual explorer photos, reflecting on what hope looks like for them today. Group engaged in discussion around how their definitions of hope are present today in hospital.  Modalities: Psycho-social ed, Adlerian, Narrative, MI  Patient Progress: Monnie attended group and actively engaged and participated in group conversation and activities.  Her comments demonstrated good insight and she was supportive of peers.

## 2022-05-25 NOTE — Progress Notes (Signed)
   05/24/22 2300  Psych Admission Type (Psych Patients Only)  Admission Status Voluntary  Psychosocial Assessment  Patient Complaints Sleep disturbance  Eye Contact Brief  Facial Expression Animated  Affect Appropriate to circumstance  Speech Logical/coherent  Interaction Assertive  Motor Activity Fidgety  Appearance/Hygiene Unremarkable  Behavior Characteristics Cooperative;Appropriate to situation  Mood Anxious  Thought Process  Coherency WDL  Content WDL  Delusions None reported or observed  Perception WDL  Hallucination None reported or observed  Judgment Poor  Confusion WDL  Danger to Self  Current suicidal ideation? Denies  Self-Injurious Behavior No self-injurious ideation or behavior indicators observed or expressed   Agreement Not to Harm Self Yes  Description of Agreement verbal  Danger to Others  Danger to Others None reported or observed

## 2022-05-25 NOTE — Progress Notes (Signed)
[facility] MD Progress Note  05/25/2022 8:57 AM Janice Vaughan  MRN:  [id number]   Reason for Admission:  The patient is a 52 year old female with a past psychiatric history of major depressive disorder, diagnosed by her outpatient psychiatric provider.  She presented voluntarily to the [facility] behavioral urgent care for suicidal thoughts.  Patient is admitted on a voluntary basis to the [facility] behavioral hospital.   Information obtained from 24-hour nursing report: The patient slept well and was cooperative and appropriate on the unit.  Information Obtained Today During Patient Interview: The patient confirms that she slept well.  She reports a strong interest in doing vocational rehab at [facility] upon discharge.  She says that she discussed this with the Child psychotherapist.  I discussed the potential for partial hospitalization program.  The patient politely declined this and said that she would prefer some form of social interaction that is in person.  She is clearly future oriented and thinking about discharge.  Discussed possible discharge date of 5/7.  The patient reports good mood, appetite, and sleep. She denies suicidal and homicidal thoughts. She denies side effects from her medications.  Review of systems as below.   Sleep  Sleep: Interrupted sleep reported also reports difficulty falling asleep  Principal Problem: MDD (major depressive disorder), recurrent severe, without psychosis (HCC) Diagnosis: Principal Problem:   MDD (major depressive disorder), recurrent severe, without psychosis (HCC) Active Problems:   Suicidal ideations    Past Psychiatric History: See H&P  Past Medical History:  Past Medical History:  Diagnosis Date   Anxiety    Cellulitis of breast    DM (diabetes mellitus) (HCC)    GERD (gastroesophageal reflux disease)    History of malignant neoplasm of breast    History of psychological trauma    Hypertension    Lymphedema    Pruritic rash     Past  Surgical History:  Procedure Laterality Date   KNEE SURGERY Left 02/05/2012   Family History:  Family History  Problem Relation Age of Onset   Hypertension Mother    Diabetes Mother    Kidney disease Mother    Schizophrenia Son    Bipolar disorder Son    Family Psychiatric  History: See H&P Social History: See H&P  Current Medications: Current Facility-Administered Medications  Medication Dose Route Frequency Provider Last Rate Last Admin   acetaminophen (TYLENOL) tablet 650 mg  650 mg Oral Q6H PRN Bing Neighbors, NP       alum & mag hydroxide-simeth (MAALOX/MYLANTA) 200-200-20 MG/5ML suspension 30 mL  30 mL Oral Q4H PRN Bing Neighbors, NP       amLODipine (NORVASC) tablet 10 mg  10 mg Oral Daily Abbott Pao, Nadir, MD   10 mg at 05/25/22 0726   ARIPiprazole (ABILIFY) tablet 5 mg  5 mg Oral Daily Carlyn Reichert, MD   5 mg at 05/25/22 7829   haloperidol (HALDOL) tablet 5 mg  5 mg Oral Q4H PRN Sarita Bottom, MD       And   LORazepam (ATIVAN) tablet 2 mg  2 mg Oral Q4H PRN Abbott Pao, Nadir, MD       And   benztropine (COGENTIN) tablet 1 mg  1 mg Oral Q4H PRN Attiah, Nadir, MD       haloperidol lactate (HALDOL) injection 5 mg  5 mg Intramuscular Q4H PRN Attiah, Nadir, MD       And   LORazepam (ATIVAN) injection 2 mg  2 mg Intramuscular Q4H PRN Attiah, Nadir,  MD       And   benztropine mesylate (COGENTIN) injection 1 mg  1 mg Intramuscular Q4H PRN Abbott Pao, Nadir, MD       cloNIDine (CATAPRES) tablet 0.1 mg  0.1 mg Oral BID PRN Starleen Blue, NP   0.1 mg at 05/25/22 1610   hydrochlorothiazide (HYDRODIURIL) tablet 25 mg  25 mg Oral Daily Bing Neighbors, NP   25 mg at 05/25/22 9604   hydrOXYzine (ATARAX) tablet 25 mg  25 mg Oral TID PRN Bing Neighbors, NP   25 mg at 05/24/22 2111   loratadine (CLARITIN) tablet 10 mg  10 mg Oral Daily Bing Neighbors, NP   10 mg at 05/25/22 0726   magnesium hydroxide (MILK OF MAGNESIA) suspension 30 mL  30 mL Oral Daily PRN Bing Neighbors,  NP   30 mL at 05/24/22 5409   metFORMIN (GLUCOPHAGE) tablet 850 mg  850 mg Oral Q breakfast Bing Neighbors, NP   850 mg at 05/25/22 0726   naproxen (NAPROSYN) tablet 500 mg  500 mg Oral BID PRN Bing Neighbors, NP       pantoprazole (PROTONIX) EC tablet 40 mg  40 mg Oral Daily Carlyn Reichert, MD   40 mg at 05/25/22 0726   potassium chloride SA (KLOR-CON M) CR tablet 20 mEq  20 mEq Oral Daily Bing Neighbors, NP   20 mEq at 05/25/22 0726   rosuvastatin (CRESTOR) tablet 10 mg  10 mg Oral Daily Bing Neighbors, NP   10 mg at 05/25/22 8119   tamoxifen (NOLVADEX) tablet 20 mg  20 mg Oral Daily Bing Neighbors, NP   20 mg at 05/25/22 0726   traZODone (DESYREL) tablet 300 mg  300 mg Oral QHS Attiah, Nadir, MD   300 mg at 05/24/22 2111   venlafaxine XR (EFFEXOR-XR) 24 hr capsule 225 mg  225 mg Oral Q breakfast Bing Neighbors, NP   225 mg at 05/25/22 1478    Lab Results:  Results for orders placed or performed during the hospital encounter of 05/19/22 (from the past 48 hour(s))  Glucose, capillary     Status: None   Collection Time: 05/23/22 11:47 AM  Result Value Ref Range   Glucose-Capillary 81 70 - 99 mg/dL    Comment: Glucose reference range applies only to samples taken after fasting for at least 8 hours.  Glucose, capillary     Status: Abnormal   Collection Time: 05/23/22  4:10 PM  Result Value Ref Range   Glucose-Capillary 142 (H) 70 - 99 mg/dL    Comment: Glucose reference range applies only to samples taken after fasting for at least 8 hours.  Glucose, capillary     Status: Abnormal   Collection Time: 05/23/22  8:38 PM  Result Value Ref Range   Glucose-Capillary 149 (H) 70 - 99 mg/dL    Comment: Glucose reference range applies only to samples taken after fasting for at least 8 hours.   Comment 1 Notify RN    Comment 2 Document in Chart   Glucose, capillary     Status: Abnormal   Collection Time: 05/24/22  5:51 AM  Result Value Ref Range   Glucose-Capillary 120  (H) 70 - 99 mg/dL    Comment: Glucose reference range applies only to samples taken after fasting for at least 8 hours.   Comment 1 Notify RN    Comment 2 Document in Chart   Glucose, capillary     Status:  Abnormal   Collection Time: 05/24/22  9:48 AM  Result Value Ref Range   Glucose-Capillary 105 (H) 70 - 99 mg/dL    Comment: Glucose reference range applies only to samples taken after fasting for at least 8 hours.  Comprehensive metabolic panel     Status: Abnormal   Collection Time: 05/25/22  6:56 AM  Result Value Ref Range   Sodium 139 135 - 145 mmol/L   Potassium 3.7 3.5 - 5.1 mmol/L   Chloride 98 98 - 111 mmol/L   CO2 30 22 - 32 mmol/L   Glucose, Bld 126 (H) 70 - 99 mg/dL    Comment: Glucose reference range applies only to samples taken after fasting for at least 8 hours.   BUN 15 6 - 20 mg/dL   Creatinine, Ser 0.98 (H) 0.44 - 1.00 mg/dL   Calcium 9.2 8.9 - 11.9 mg/dL   Total Protein 7.9 6.5 - 8.1 g/dL   Albumin 4.2 3.5 - 5.0 g/dL   AST 15 15 - 41 U/L   ALT 14 0 - 44 U/L   Alkaline Phosphatase 64 38 - 126 U/L   Total Bilirubin 0.5 0.3 - 1.2 mg/dL   GFR, Estimated >14 >78 mL/min    Comment: (NOTE) Calculated using the CKD-EPI Creatinine Equation (2021)    Anion gap 11 5 - 15    Comment: Performed at [facility], [address] [place], [place] [address]    Blood Alcohol level:  Lab Results  Component Value Date   ETH <10 05/18/2022    Metabolic Disorder Labs: Lab Results  Component Value Date   HGBA1C 7.1 (H) 05/18/2022   MPG 157.07 05/18/2022   No results found for: "PROLACTIN" Lab Results  Component Value Date   CHOL 255 (H) 05/18/2022   TRIG 156 (H) 05/18/2022   HDL 54 05/18/2022   CHOLHDL 4.7 05/18/2022   VLDL 31 05/18/2022   LDLCALC 170 (H) 05/18/2022        Psychiatric Specialty Exam: Physical Exam Constitutional:      Appearance: the patient is not toxic-appearing.  Pulmonary:     Effort: Pulmonary effort is normal.   Neurological:     General: No focal deficit present.     Mental Status: the patient is alert and oriented to person, place, and time.   Review of Systems  Respiratory:  Negative for shortness of breath.   Cardiovascular:  Negative for chest pain.  Gastrointestinal:  Negative for abdominal pain, constipation, diarrhea, nausea and vomiting.  Neurological:  Negative for headaches.      BP (!) 142/95   Pulse 79   Temp 98.3 F (36.8 C) (Oral)   Resp 14   Ht 5\' 4"  (1.626 m)   Wt 72 kg   SpO2 100%   BMI 27.26 kg/m   General Appearance: Fairly Groomed  Eye Contact:  Good  Speech:  Clear and Coherent  Volume:  Normal  Mood:  Euthymic  Affect:  Congruent  Thought Process:  Coherent  Orientation:  Full (Time, Place, and Person)  Thought Content: Logical   Suicidal Thoughts:  No  Homicidal Thoughts:  No  Memory:  Immediate;   Good  Judgement:  fair  Insight:  fair  Psychomotor Activity:  Normal  Concentration:  Concentration: Good  Recall:  Good  Fund of Knowledge: Good  Language: Good  Akathisia:  No  Handed:  not assessed  AIMS (if indicated): not done  Assets:  Communication Skills Desire for Improvement  Financial Resources/Insurance Housing Leisure Time Physical Health  ADL's:  Intact  Cognition: WNL  Sleep:  Fair      ASSESSMENT:  Diagnoses / Active Problems: Principal Problem: MDD (major depressive disorder), recurrent severe, without psychosis (HCC) Diagnosis: Principal Problem:   MDD (major depressive disorder), recurrent severe, without psychosis (HCC) Active Problems:   Suicidal ideations   PLAN: Safety and Monitoring:  -- Voluntary admission to inpatient psychiatric unit for safety, stabilization and treatment  -- Daily contact with patient to assess and evaluate symptoms and progress in treatment  -- Patient's case to be discussed in multi-disciplinary team meeting  -- Observation Level : q15 minute checks  -- Vital signs:  q12 hours  --  Precautions: suicide, elopement, and assault  2. Medications:   Continue Effexor 225 mg daily for depression and anxiety  Continue Abilify 5 mg daily to augment antidepressant effect and help with mood stabilization  Continue Glucophage 850 mg daily for diabetes, home medication  Continue Protonix 40 mg daily GERD, home medication  Continue potassium chloride 20 daily, home medications  Continue Crestor 10 mg daily for hyperlipidemia, home medication  Continue Claritin 10 mg daily for allergic rhinitis, home medication  Continue hydrochlorothiazide 25 mg daily for hypertension, home medication  Continue Norvasc 10 mg daily for hypertension (dose increased from 5 mg daily during this hospitalization)  Continue trazodone 300 mg nightly for sleep, dose increased during this hospitalization  Continue Atarax 25 mg 3 times daily as needed for anxiety   The risks/benefits/side-effects/alternatives to this medication were discussed in detail with the patient and time was given for questions. The patient consents to medication trial.    -- Metabolic profile and EKG monitoring obtained while on an atypical antipsychotic (BMI: Lipid Panel: HbgA1c: QTc:)      3. Pertinent labs: notable for A1c of 7.1, CBGs unremarkable, mild hypokalemia of 3.2, otherwise unremarkable.  Potassium replaced and home potassium restarted.  Potassium normal on recheck.  EKG 4/29 QTc 470 EKG 5/4 normal sinus rhythm QTc 468/stable      Lab ordered: CMP 5/6   4. Group and Therapy: -- Encouraged patient to participate in unit milieu and in scheduled group therapies       -- Short Term Goals: Ability to identify changes in lifestyle to reduce recurrence of condition will improve, Ability to verbalize feelings will improve, Ability to disclose and discuss suicidal ideas, Ability to demonstrate self-control will improve, and Ability to identify and develop effective coping behaviors will improve  -- Long Term Goals:  Improvement in symptoms so as ready for discharge  6. Discharge Planning:   -- Social work and case management to assist with discharge planning and identification of hospital follow-up needs prior to discharge  -- Estimated LOS: 5-7 days  -- Discharge Concerns: Need to establish a safety plan; Medication compliance and effectiveness  -- Discharge Goals: Return home with outpatient referrals for mental health follow-up including medication management/psychotherapy  Carlyn Reichert, MD 05/25/2022, 8:57 AM

## 2022-05-25 NOTE — Progress Notes (Signed)
   05/25/22 1037  Psych Admission Type (Psych Patients Only)  Admission Status Voluntary  Psychosocial Assessment  Patient Complaints None  Eye Contact Fair  Facial Expression Animated  Affect Appropriate to circumstance  Speech Logical/coherent  Interaction Assertive  Motor Activity Restless  Appearance/Hygiene Unremarkable  Behavior Characteristics Cooperative  Mood Anxious  Thought Process  Coherency WDL  Content WDL  Delusions None reported or observed  Perception WDL  Hallucination None reported or observed  Judgment Poor  Confusion WDL  Danger to Self  Current suicidal ideation? Denies  Self-Injurious Behavior No self-injurious ideation or behavior indicators observed or expressed   Agreement Not to Harm Self Yes  Description of Agreement verbal  Danger to Others  Danger to Others None reported or observed

## 2022-05-25 NOTE — BH IP Treatment Plan (Signed)
Interdisciplinary Treatment and Diagnostic Plan Update  05/25/2022 Time of Session: 0830 Janice Vaughan MRN: [id number]  Principal Diagnosis: MDD (major depressive disorder), recurrent severe, without psychosis (HCC)  Secondary Diagnoses: Principal Problem:   MDD (major depressive disorder), recurrent severe, without psychosis (HCC) Active Problems:   Suicidal ideations   Current Medications:  Current Facility-Administered Medications  Medication Dose Route Frequency Provider Last Rate Last Admin   acetaminophen (TYLENOL) tablet 650 mg  650 mg Oral Q6H PRN Bing Neighbors, NP       alum & mag hydroxide-simeth (MAALOX/MYLANTA) 200-200-20 MG/5ML suspension 30 mL  30 mL Oral Q4H PRN Bing Neighbors, NP       amLODipine (NORVASC) tablet 10 mg  10 mg Oral Daily Abbott Pao, Nadir, MD   10 mg at 05/25/22 0726   ARIPiprazole (ABILIFY) tablet 5 mg  5 mg Oral Daily Carlyn Reichert, MD   5 mg at 05/25/22 6295   haloperidol (HALDOL) tablet 5 mg  5 mg Oral Q4H PRN Sarita Bottom, MD       And   LORazepam (ATIVAN) tablet 2 mg  2 mg Oral Q4H PRN Abbott Pao, Nadir, MD       And   benztropine (COGENTIN) tablet 1 mg  1 mg Oral Q4H PRN Abbott Pao, Nadir, MD       haloperidol lactate (HALDOL) injection 5 mg  5 mg Intramuscular Q4H PRN Abbott Pao, Nadir, MD       And   LORazepam (ATIVAN) injection 2 mg  2 mg Intramuscular Q4H PRN Abbott Pao, Nadir, MD       And   benztropine mesylate (COGENTIN) injection 1 mg  1 mg Intramuscular Q4H PRN Abbott Pao, Nadir, MD       cloNIDine (CATAPRES) tablet 0.1 mg  0.1 mg Oral BID PRN Starleen Blue, NP   0.1 mg at 05/25/22 0622   hydrochlorothiazide (HYDRODIURIL) tablet 25 mg  25 mg Oral Daily Bing Neighbors, NP   25 mg at 05/25/22 0726   hydrOXYzine (ATARAX) tablet 25 mg  25 mg Oral TID PRN Bing Neighbors, NP   25 mg at 05/24/22 2111   loratadine (CLARITIN) tablet 10 mg  10 mg Oral Daily Bing Neighbors, NP   10 mg at 05/25/22 0726   magnesium hydroxide (MILK OF MAGNESIA)  suspension 30 mL  30 mL Oral Daily PRN Bing Neighbors, NP   30 mL at 05/24/22 2841   metFORMIN (GLUCOPHAGE) tablet 850 mg  850 mg Oral Q breakfast Bing Neighbors, NP   850 mg at 05/25/22 0726   naproxen (NAPROSYN) tablet 500 mg  500 mg Oral BID PRN Bing Neighbors, NP       pantoprazole (PROTONIX) EC tablet 40 mg  40 mg Oral Daily Carlyn Reichert, MD   40 mg at 05/25/22 0726   potassium chloride SA (KLOR-CON M) CR tablet 20 mEq  20 mEq Oral Daily Bing Neighbors, NP   20 mEq at 05/25/22 0726   rosuvastatin (CRESTOR) tablet 10 mg  10 mg Oral Daily Bing Neighbors, NP   10 mg at 05/25/22 0726   tamoxifen (NOLVADEX) tablet 20 mg  20 mg Oral Daily Bing Neighbors, NP   20 mg at 05/25/22 0726   traZODone (DESYREL) tablet 300 mg  300 mg Oral QHS Attiah, Nadir, MD   300 mg at 05/24/22 2111   venlafaxine XR (EFFEXOR-XR) 24 hr capsule 225 mg  225 mg Oral Q breakfast Bing Neighbors, NP   225  mg at 05/25/22 0726   PTA Medications: Medications Prior to Admission  Medication Sig Dispense Refill Last Dose   ALPRAZolam (XANAX) 1 MG tablet Take 0.5-1 mg by mouth 2 (two) times daily as needed (For severe anxiety).      amLODipine (NORVASC) 10 MG tablet Take 10 mg by mouth daily.      celecoxib (CELEBREX) 200 MG capsule Take 200 mg by mouth daily as needed for mild pain.      cetirizine (ZYRTEC) 10 MG tablet Take 10 mg by mouth daily.      esomeprazole (NEXIUM) 20 MG capsule Take 20 mg by mouth daily.      hydrochlorothiazide (HYDRODIURIL) 25 MG tablet Take 25 mg by mouth daily.      metFORMIN (GLUCOPHAGE) 850 MG tablet Take 1 tablet (850 mg total) by mouth daily with breakfast.      rosuvastatin (CRESTOR) 10 MG tablet Take 10 mg by mouth daily. (Patient not taking: Reported on 05/18/2022)      tamoxifen (NOLVADEX) 20 MG tablet Take 20 mg by mouth daily.      traZODone (DESYREL) 150 MG tablet Take 150 mg by mouth at bedtime.      venlafaxine XR (EFFEXOR-XR) 75 MG 24 hr capsule Take 3  capsules (225 mg total) by mouth daily with breakfast.       Patient Stressors:    Patient Strengths: Ability for insight   Treatment Modalities: Medication Management, Group therapy, Case management,  1 to 1 session with clinician, Psychoeducation, Recreational therapy.   Physician Treatment Plan for Primary Diagnosis: MDD (major depressive disorder), recurrent severe, without psychosis (HCC) Long Term Goal(s): Improvement in symptoms so as ready for discharge   Short Term Goals: Ability to identify changes in lifestyle to reduce recurrence of condition will improve Ability to verbalize feelings will improve Ability to disclose and discuss suicidal ideas Ability to demonstrate self-control will improve Ability to identify and develop effective coping behaviors will improve  Medication Management: Evaluate patient's response, side effects, and tolerance of medication regimen.  Therapeutic Interventions: 1 to 1 sessions, Unit Group sessions and Medication administration.  Evaluation of Outcomes: Progressing  Physician Treatment Plan for Secondary Diagnosis: Principal Problem:   MDD (major depressive disorder), recurrent severe, without psychosis (HCC) Active Problems:   Suicidal ideations  Long Term Goal(s): Improvement in symptoms so as ready for discharge   Short Term Goals: Ability to identify changes in lifestyle to reduce recurrence of condition will improve Ability to verbalize feelings will improve Ability to disclose and discuss suicidal ideas Ability to demonstrate self-control will improve Ability to identify and develop effective coping behaviors will improve     Medication Management: Evaluate patient's response, side effects, and tolerance of medication regimen.  Therapeutic Interventions: 1 to 1 sessions, Unit Group sessions and Medication administration.  Evaluation of Outcomes: Progressing   RN Treatment Plan for Primary Diagnosis: MDD (major depressive  disorder), recurrent severe, without psychosis (HCC) Long Term Goal(s): Knowledge of disease and therapeutic regimen to maintain health will improve  Short Term Goals: Ability to remain free from injury will improve, Ability to verbalize frustration and anger appropriately will improve, Ability to demonstrate self-control, Ability to participate in decision making will improve, Ability to verbalize feelings will improve, Ability to disclose and discuss suicidal ideas, Ability to identify and develop effective coping behaviors will improve, and Compliance with prescribed medications will improve  Medication Management: RN will administer medications as ordered by provider, will assess and evaluate patient's response and  provide education to patient for prescribed medication. RN will report any adverse and/or side effects to prescribing provider.  Therapeutic Interventions: 1 on 1 counseling sessions, Psychoeducation, Medication administration, Evaluate responses to treatment, Monitor vital signs and CBGs as ordered, Perform/monitor CIWA, COWS, AIMS and Fall Risk screenings as ordered, Perform wound care treatments as ordered.  Evaluation of Outcomes: Progressing   LCSW Treatment Plan for Primary Diagnosis: MDD (major depressive disorder), recurrent severe, without psychosis (HCC) Long Term Goal(s): Safe transition to appropriate next level of care at discharge, Engage patient in therapeutic group addressing interpersonal concerns.  Short Term Goals: Engage patient in aftercare planning with referrals and resources, Increase social support, Increase ability to appropriately verbalize feelings, Increase emotional regulation, Facilitate acceptance of mental health diagnosis and concerns, Facilitate patient progression through stages of change regarding substance use diagnoses and concerns, Identify triggers associated with mental health/substance abuse issues, and Increase skills for wellness and  recovery  Therapeutic Interventions: Assess for all discharge needs, 1 to 1 time with Social worker, Explore available resources and support systems, Assess for adequacy in community support network, Educate family and significant other(s) on suicide prevention, Complete Psychosocial Assessment, Interpersonal group therapy.  Evaluation of Outcomes: Progressing   Progress in Treatment: Attending groups: Yes. Participating in groups: Yes. Taking medication as prescribed: Yes. Toleration medication: Yes. Family/Significant other contact made: No, will contact:  CSW to obtain consent  Patient understands diagnosis: Yes. Discussing patient identified problems/goals with staff: Yes. Medical problems stabilized or resolved: Yes. Denies suicidal/homicidal ideation: No. Issues/concerns per patient self-inventory: Yes. Other: none  New problem(s) identified: No, Describe:  none  New Short Term/Long Term Goal(s): Patient to work towards detox, elimination of symptoms of psychosis, medication management for mood stabilization; elimination of SI thoughts; development of comprehensive mental wellness/sobriety plan.  Patient Goals:  No additional goals identified at this time. Patient to continue to work towards original goals identified in initial treatment team meeting. CSW will remain available to patient should they voice additional treatment goals.   Discharge Plan or Barriers:  No psychosocial barriers identified at this time, patient to return to place of residence when appropriate for discharge.   Reason for Continuation of Hospitalization: Depression Medication stabilization  Estimated Length of Stay: 1-7 days   Scribe for Treatment Team: Almedia Balls 05/25/2022 2:14 PM

## 2022-05-25 NOTE — B[facility] Group Notes (Signed)
Adult Psychoeducational Group Note  Date:  05/25/2022 Time:  9:20 AM  Group Topic/Focus:  Goals Group:   The focus of this group is to help patients establish daily goals to achieve during treatment and discuss how the patient can incorporate goal setting into their daily lives to aide in recovery.  Participation Level:  Active  Participation Quality:  Appropriate  Affect:  Appropriate  Cognitive:  Appropriate  Insight: Appropriate  Engagement in Group:  Engaged  Modes of Intervention:  Discussion  Additional Comments:  Pt stated in group today that her goal is to work on discharge. She plans on achieving this goal by taking time out to speak with social worker and doctor.   [name] [name] 05/25/2022, 9:20 AM

## 2022-05-25 NOTE — Progress Notes (Signed)
Adult Psychoeducational Group Note  Date:  05/25/2022 Time:  8:44 PM  Group Topic/Focus:  Wrap-Up Group:   The focus of this group is to help patients review their daily goal of treatment and discuss progress on daily workbooks.  Participation Level:  Active  Participation Quality:  Appropriate  Affect:  Appropriate  Cognitive:  Appropriate  Insight: Appropriate  Engagement in Group:  Engaged  Modes of Intervention:  Discussion  Additional Comments:  Quiana attend wrap AA group  Charna Busman Long 05/25/2022, 8:44 PM

## 2022-05-25 NOTE — Progress Notes (Signed)
   05/25/22 2100  Psych Admission Type (Psych Patients Only)  Admission Status Voluntary  Psychosocial Assessment  Patient Complaints None  Eye Contact Fair  Facial Expression Animated  Affect Appropriate to circumstance  Speech Logical/coherent  Interaction Assertive  Motor Activity Restless  Appearance/Hygiene Unremarkable  Behavior Characteristics Cooperative  Mood Anxious  Thought Process  Coherency WDL  Content WDL  Delusions None reported or observed  Perception WDL  Hallucination None reported or observed  Judgment Poor  Confusion WDL  Danger to Self  Current suicidal ideation? Denies  Self-Injurious Behavior No self-injurious ideation or behavior indicators observed or expressed   Agreement Not to Harm Self Yes  Description of Agreement verbal  Danger to Others  Danger to Others None reported or observed   Alert/oriented. Makes needs/concerns known to staff. Pleasant cooperative with staff. Denies SI/HI/A/V hallucinations. Med compliant. Patient states went to group. Will encourage continued compliance and progression towards goals. Verbally contracted for safety. Will continue to monitor.

## 2022-05-25 NOTE — Group Note (Signed)
Occupational Therapy Group Note  Group Topic:Coping Skills  Group Date: 05/25/2022 Start Time: 1430 End Time: 1500 Facilitators: Daqwan Dougal G, OT   Group Description: Group encouraged increased engagement and participation through discussion and activity focused on "Coping Ahead." Patients were split up into teams and selected a card from a stack of positive coping strategies. Patients were instructed to act out/charade the coping skill for other peers to guess and receive points for their team. Discussion followed with a focus on identifying additional positive coping strategies and patients shared how they were going to cope ahead over the weekend while continuing hospitalization stay.  Therapeutic Goal(s): Identify positive vs negative coping strategies. Identify coping skills to be used during hospitalization vs coping skills outside of hospital/at home Increase participation in therapeutic group environment and promote engagement in treatment   Participation Level: Engaged   Participation Quality: Independent   Behavior: Appropriate   Speech/Thought Process: Relevant   Affect/Mood: Appropriate   Insight: Fair   Judgement: Fair      Modes of Intervention: Education  Patient Response to Interventions:  Attentive   Plan: Continue to engage patient in OT groups 2 - 3x/week.  05/25/2022  Azayla Polo G Latia Mataya, OT Meisha Salone, OT   

## 2022-05-26 MED ORDER — HYDROXYZINE HCL 25 MG PO TABS: 25.0000 mg | ORAL_TABLET | Freq: Three times a day (TID) | ORAL | 0 refills | Status: AC | PRN

## 2022-05-26 MED ORDER — TRAZODONE HCL 300 MG PO TABS: 300.0000 mg | ORAL_TABLET | Freq: Every day | ORAL | 0 refills | Status: AC

## 2022-05-26 MED ORDER — ARIPIPRAZOLE 5 MG PO TABS: 5.0000 mg | ORAL_TABLET | Freq: Every day | ORAL | 0 refills | Status: AC

## 2022-05-26 NOTE — Progress Notes (Signed)
  [facility] [place] Adult Case Management Discharge Plan :  Will you be returning to the same living situation after discharge:  Yes,  pt will be returning to her own residence At discharge, do you have transportation home?: Yes,  pt will be provided with transportation to her vehicle that is parked at [facility] Do you have the ability to pay for your medications: Yes,  Insured  Release of information consent forms completed and in the chart;  Patient's signature needed at discharge.  Patient to Follow up at:  Follow-up Information     [facility] Follow up.   Why: Please go to [facility] [place] for an assessment during walk in hours. Walk In Hours are Mon - Fri from 8am -3pm When arriving, let registration know you were discharged from the hospital to insure you receive the proper paperwork to fill out. Contact information: [address] [place] [address] [phone]                 Next level of care provider has access to [facility] Link:yes  Safety Planning and Suicide Prevention discussed: Yes,  Discussed with patient. Pt declined consents to speak with collaterals      Has patient been referred to the Quitline?: Patient refused referral for treatment  Patient has been referred for addiction treatment: No known substance use disorder. Patient to continue working towards treatment goals after discharge. Patient no longer meets criteria for inpatient criteria per attending physician. Continue taking medications as prescribed, nursing to provide instructions at discharge. Follow up with all scheduled appointments.   Melbourne Jakubiak S Jandy Brackens, LCSW 05/26/2022, 10:12 AM

## 2022-05-26 NOTE — [facility] Suicide Risk Assessment (Signed)
Suicide Risk Assessment  Discharge Assessment    [facility] Discharge Suicide Risk Assessment   Principal Problem: MDD (major depressive disorder), recurrent severe, without psychosis (HCC) Discharge Diagnoses: Principal Problem:   MDD (major depressive disorder), recurrent severe, without psychosis (HCC) Active Problems:   Suicidal ideations   Total Time spent with patient: 15 minutes   During the patient's hospitalization, patient had extensive initial psychiatric evaluation, and follow-up psychiatric evaluations every day.  Upon further evaluation the diagnoses were given as follows:  Major depressive disorder, recurrent, severe  Patient's psychiatric medications were adjusted on admission:  Continue home Effexor 225 mg daily Continue home trazodone 150 mg nightly - Add Abilify 5 mg daily for augmentation of major depressive disorder treatment  During the hospitalization, other adjustments were made to the patient's psychiatric medication regimen: Trazodone was increased to 300 mg nightly  Gradually, patient started adjusting to milieu.   Patient's care was discussed during the interdisciplinary team meeting every day during the hospitalization.  The patient denied having side effects to prescribed psychiatric medication.  The patient reports their target psychiatric symptoms of depression responded well to the psychiatric medications, and the patient reports overall benefit other psychiatric hospitalization. Supportive psychotherapy was provided to the patient. The patient also participated in regular group therapy while admitted.   Labs were reviewed with the patient, and abnormal results were discussed with the patient.  The patient denied having suicidal thoughts more than 48 hours prior to discharge.  Patient denies having homicidal thoughts.  Patient denies having auditory hallucinations.  Patient denies any visual hallucinations.  Patient denies having paranoid  thoughts.  The patient is able to verbalize their individual safety plan to this provider.  It is recommended to the patient to continue psychiatric medications as prescribed, after discharge from the hospital.    It is recommended to the patient to follow up with your outpatient psychiatric provider and PCP.  Discussed with the patient, the impact of alcohol, drugs, tobacco have been there overall psychiatric and medical wellbeing, and total abstinence from substance use was recommended the patient.  Psychiatric Specialty Exam: Physical Exam Constitutional:      Appearance: the patient is not toxic-appearing.  Pulmonary:     Effort: Pulmonary effort is normal.  Neurological:     General: No focal deficit present.     Mental Status: the patient is alert and oriented to person, place, and time.   Review of Systems  Respiratory:  Negative for shortness of breath.   Cardiovascular:  Negative for chest pain.  Gastrointestinal:  Negative for abdominal pain, constipation, diarrhea, nausea and vomiting.  Neurological:  Negative for headaches.      BP (!) 189/101   Pulse 81   Temp 98.3 F (36.8 C) (Oral)   Resp 14   Ht 5\' 4"  (1.626 m)   Wt 72 kg   SpO2 99%   BMI 27.26 kg/m   General Appearance: Fairly Groomed  Eye Contact:  Good  Speech:  Clear and Coherent  Volume:  Normal  Mood:  Euthymic  Affect:  Congruent  Thought Process:  Coherent  Orientation:  Full (Time, Place, and Person)  Thought Content: Logical   Suicidal Thoughts:  No  Homicidal Thoughts:  No  Memory:  Immediate;   Good  Judgement:  fair  Insight:  fair  Psychomotor Activity:  Normal  Concentration:  Concentration: Good  Recall:  Good  Fund of Knowledge: Good  Language: Good  Akathisia:  No  Handed:  not  assessed  AIMS (if indicated): not done  Assets:  Communication Skills Desire for Improvement Financial Resources/Insurance Housing Leisure Time Physical Health  ADL's:  Intact  Cognition: WNL   Sleep:  Fair      Mental Status Per Nursing Assessment::   On Admission:  NA  Demographic Factors:  None  Loss Factors: NA  Historical Factors: NA  Risk Reduction Factors:   Positive social support, Positive therapeutic relationship, and Positive coping skills or problem solving skills  Continued Clinical Symptoms:  Depression  Cognitive Features That Contribute To Risk:  None    Suicide Risk:  Mild    Follow-up Information     [facility] Follow up.   Why: Please go to [facility] [place] for an assessment during walk in hours. Walk In Hours are Mon - Fri from 8am -3pm When arriving, let registration know you were discharged from the hospital to insure you receive the proper paperwork to fill out. Contact information: [address] [place] [address] [phone]                 Plan Of Care/Follow-up recommendations:  Activity as tolerated. Diet as recommended by PCP. Keep all scheduled follow-up appointments as recommended.  Patient is instructed to take all prescribed medications as recommended. Report any side effects or adverse reactions to your outpatient psychiatrist. Patient is instructed to abstain from alcohol and illegal drugs while on prescription medications. In the event of worsening symptoms, patient is instructed to call the crisis hotline, 911, or go to the nearest emergency department for evaluation and treatment.  Prescriptions given at discharge. Patient agreeable to plan. Given opportunity to ask questions. Appears to feel comfortable with discharge.  Patient is also instructed prior to discharge to: Take all medications as prescribed by mental healthcare provider. Report any adverse effects and or reactions from the medicines to outpatient provider promptly. Patient has been instructed & cautioned: To not engage in alcohol and or illegal drug use while on prescription medicines. In the event of  worsening symptoms,  patient is instructed to call the crisis hotline, 911 and or go to the nearest ED for appropriate evaluation and treatment of symptoms. To follow-up with primary care provider for other medical issues, concerns and or health care needs  The patient was evaluated each day by a clinical provider to ascertain response to treatment. Improvement was noted by the patient's report of decreasing symptoms, improved sleep and appetite, affect, medication tolerance, behavior, and participation in unit programming.  Patient was asked each day to complete a self inventory noting mood, mental status, pain, new symptoms, anxiety and concerns.  Patient responded well to medication and being in a therapeutic and supportive environment. Positive and appropriate behavior was noted and the patient was motivated for recovery. The patient worked closely with the treatment team and case manager to develop a discharge plan with appropriate goals. Coping skills, problem solving as well as relaxation therapies were also part of the unit programming.  By the day of discharge patient was in much improved condition than upon admission.  Symptoms were reported as significantly decreased or resolved completely. The patient was motivated to continue taking medication with a goal of continued improvement in mental health.     Carlyn Reichert, MD 05/26/2022, 9:16 AM

## 2022-05-26 NOTE — Discharge Summary (Signed)
Physician Discharge Summary Note  Patient:  Janice Vaughan is an 52 y.o., female MRN:  [id number] DOB:  Jan 20, 1970 Patient phone:  [phone] (home)  Patient address:   [address] [place] [facility] [address],  Total Time spent with patient: 15 minutes  Date of Admission:  05/19/2022 Date of Discharge: 05/26/2022  Reason for Admission:   The patient is a 52 year old female with a past psychiatric history of major depressive disorder, diagnosed by her outpatient psychiatric provider. She presented voluntarily to the [facility] behavioral urgent care for suicidal thoughts. Patient is admitted on a voluntary basis to the [facility] behavioral hospital.   Principal Problem: MDD (major depressive disorder), recurrent severe, without psychosis (HCC) Discharge Diagnoses: Principal Problem:   MDD (major depressive disorder), recurrent severe, without psychosis (HCC) Active Problems:   Suicidal ideations   Past Psychiatric History: See H and P  Past Medical History:  Past Medical History:  Diagnosis Date   Anxiety    Cellulitis of breast    DM (diabetes mellitus) (HCC)    GERD (gastroesophageal reflux disease)    History of malignant neoplasm of breast    History of psychological trauma    Hypertension    Lymphedema    Pruritic rash     Past Surgical History:  Procedure Laterality Date   KNEE SURGERY Left 02/05/2012   Family History:  Family History  Problem Relation Age of Onset   Hypertension Mother    Diabetes Mother    Kidney disease Mother    Schizophrenia Son    Bipolar disorder Son    Family Psychiatric  History: none Social History:  Social History   Substance and Sexual Activity  Alcohol Use Yes   Alcohol/week: 18.0 standard drinks of alcohol   Types: 18 Cans of beer per week   Comment: about 18 cans of beer per week      Social History   Substance and Sexual Activity  Drug Use No    Social History   Socioeconomic History   Marital status: Single     Spouse name: Not on file   Number of children: Not on file   Years of education: Not on file   Highest education level: Not on file  Occupational History   Not on file  Tobacco Use   Smoking status: Never   Smokeless tobacco: Never  Vaping Use   Vaping Use: Not on file  Substance and Sexual Activity   Alcohol use: Yes    Alcohol/week: 18.0 standard drinks of alcohol    Types: 18 Cans of beer per week    Comment: about 18 cans of beer per week    Drug use: No   Sexual activity: Not on file  Other Topics Concern   Not on file  Social History Narrative   Not on file   Social Determinants of Health   Financial Resource Strain: Not on file  Food Insecurity: No Food Insecurity (05/19/2022)   Hunger Vital Sign    Worried About Running Out of Food in the Last Year: Never true    Ran Out of Food in the Last Year: Never true  Transportation Needs: No Transportation Needs (05/19/2022)   PRAPARE - Administrator, Civil Service (Medical): No    Lack of Transportation (Non-Medical): No  Physical Activity: Not on file  Stress: Not on file  Social Connections: Not on file    Hospital Course:    During the patient's hospitalization, patient had extensive initial psychiatric  evaluation, and follow-up psychiatric evaluations every day.   Upon further evaluation the diagnoses were given as follows:  Major depressive disorder, recurrent, severe   Patient's psychiatric medications were adjusted on admission:  Continue home Effexor 225 mg daily Continue home trazodone 150 mg nightly - Add Abilify 5 mg daily for augmentation of major depressive disorder treatment   During the hospitalization, other adjustments were made to the patient's psychiatric medication regimen: Trazodone was increased to 300 mg nightly   Gradually, patient started adjusting to milieu.   Patient's care was discussed during the interdisciplinary team meeting every day during the hospitalization.   The  patient denied having side effects to prescribed psychiatric medication.   The patient reports their target psychiatric symptoms of depression responded well to the psychiatric medications, and the patient reports overall benefit other psychiatric hospitalization. Supportive psychotherapy was provided to the patient. The patient also participated in regular group therapy while admitted.    Labs were reviewed with the patient, and abnormal results were discussed with the patient.   The patient denied having suicidal thoughts more than 48 hours prior to discharge.  Patient denies having homicidal thoughts.  Patient denies having auditory hallucinations.  Patient denies any visual hallucinations.  Patient denies having paranoid thoughts.   The patient is able to verbalize their individual safety plan to this provider.   It is recommended to the patient to continue psychiatric medications as prescribed, after discharge from the hospital.     It is recommended to the patient to follow up with your outpatient psychiatric provider and PCP.   Discussed with the patient, the impact of alcohol, drugs, tobacco have been there overall psychiatric and medical wellbeing, and total abstinence from substance use was recommended the patient.  Physical Findings: AIMS: Facial and Oral Movements Muscles of Facial Expression: None, normal Lips and Perioral Area: None, normal Jaw: None, normal Tongue: None, normal,Extremity Movements Upper (arms, wrists, hands, fingers): None, normal Lower (legs, knees, ankles, toes): None, normal, Trunk Movements Neck, shoulders, hips: None, normal, Overall Severity Severity of abnormal movements (highest score from questions above): None, normal Incapacitation due to abnormal movements: None, normal Patient's awareness of abnormal movements (rate only patient's report): No Awareness, Dental Status Current problems with teeth and/or dentures?: No Does patient usually wear  dentures?: No  CIWA:    COWS:    Psychiatric Specialty Exam: Physical Exam Constitutional:      Appearance: the patient is not toxic-appearing.  Pulmonary:     Effort: Pulmonary effort is normal.  Neurological:     General: No focal deficit present.     Mental Status: the patient is alert and oriented to person, place, and time.   Review of Systems  Respiratory:  Negative for shortness of breath.   Cardiovascular:  Negative for chest pain.  Gastrointestinal:  Negative for abdominal pain, constipation, diarrhea, nausea and vomiting.  Neurological:  Negative for headaches.      BP (!) 189/101   Pulse 81   Temp 98.3 F (36.8 C) (Oral)   Resp 14   Ht 5\' 4"  (1.626 m)   Wt 72 kg   SpO2 99%   BMI 27.26 kg/m   General Appearance: Fairly Groomed  Eye Contact:  Good  Speech:  Clear and Coherent  Volume:  Normal  Mood:  Euthymic  Affect:  Congruent  Thought Process:  Coherent  Orientation:  Full (Time, Place, and Person)  Thought Content: Logical   Suicidal Thoughts:  No  Homicidal Thoughts:  No  Memory:  Immediate;   Good  Judgement:  fair  Insight:  fair  Psychomotor Activity:  Normal  Concentration:  Concentration: Good  Recall:  Good  Fund of Knowledge: Good  Language: Good  Akathisia:  No  Handed:  not assessed  AIMS (if indicated): not done  Assets:  Communication Skills Desire for Improvement Financial Resources/Insurance Housing Leisure Time Physical Health  ADL's:  Intact  Cognition: WNL  Sleep:  Fair    Social History   Tobacco Use  Smoking Status Never  Smokeless Tobacco Never   Tobacco Cessation:  N/A, patient does not currently use tobacco products   Blood Alcohol level:  Lab Results  Component Value Date   ETH <10 05/18/2022    Metabolic Disorder Labs:  Lab Results  Component Value Date   HGBA1C 7.1 (H) 05/18/2022   MPG 157.07 05/18/2022   No results found for: "PROLACTIN" Lab Results  Component Value Date   CHOL 255 (H)  05/18/2022   TRIG 156 (H) 05/18/2022   HDL 54 05/18/2022   CHOLHDL 4.7 05/18/2022   VLDL 31 05/18/2022   LDLCALC 170 (H) 05/18/2022    See Psychiatric Specialty Exam and Suicide Risk Assessment completed by Attending Physician prior to discharge.  Discharge destination:  Home  Is patient on multiple antipsychotic therapies at discharge:  No   Has Patient had three or more failed trials of antipsychotic monotherapy by history:  No  Recommended Plan for Multiple Antipsychotic Therapies: NA  Discharge Instructions     Diet - low sodium heart healthy   Complete by: As directed    Increase activity slowly   Complete by: As directed       Allergies as of 05/26/2022       Reactions   Lisinopril Itching, Other (See Comments)   Dry cough        Medication List     STOP taking these medications    ALPRAZolam 1 MG tablet Commonly known as: XANAX   celecoxib 200 MG capsule Commonly known as: CELEBREX   cetirizine 10 MG tablet Commonly known as: ZYRTEC       TAKE these medications      Indication  amLODipine 10 MG tablet Commonly known as: NORVASC Take 10 mg by mouth daily.  Indication: High Blood Pressure Disorder   ARIPiprazole 5 MG tablet Commonly known as: ABILIFY Take 1 tablet (5 mg total) by mouth daily. Start taking on: May 27, 2022  Indication: Major Depressive Disorder   esomeprazole 20 MG capsule Commonly known as: NEXIUM Take 20 mg by mouth daily.  Indication: Heartburn   hydrochlorothiazide 25 MG tablet Commonly known as: HYDRODIURIL Take 25 mg by mouth daily.  Indication: High Blood Pressure Disorder   hydrOXYzine 25 MG tablet Commonly known as: ATARAX Take 1 tablet (25 mg total) by mouth 3 (three) times daily as needed for anxiety.  Indication: Feeling Anxious   metFORMIN 850 MG tablet Commonly known as: GLUCOPHAGE Take 1 tablet (850 mg total) by mouth daily with breakfast.  Indication: Type 2 Diabetes   rosuvastatin 10 MG  tablet Commonly known as: CRESTOR Take 10 mg by mouth daily.  Indication: High Amount of Fats in the Blood   tamoxifen 20 MG tablet Commonly known as: NOLVADEX Take 20 mg by mouth daily.  Indication: Intraductal Breast Cancer   trazodone 300 MG tablet Commonly known as: DESYREL Take 1 tablet (300 mg total) by mouth at bedtime. What changed:  medication strength how much  to take  Indication: Trouble Sleeping   venlafaxine XR 75 MG 24 hr capsule Commonly known as: EFFEXOR-XR Take 3 capsules (225 mg total) by mouth daily with breakfast.  Indication: Major Depressive Disorder        Follow-up Information     [facility] Follow up.   Why: Please go to [facility] [place] for an assessment during walk in hours. Walk In Hours are Mon - Fri from 8am -3pm When arriving, let registration know you were discharged from the hospital to insure you receive the proper paperwork to fill out. Contact information: [address] [place] [address] [phone]                 Follow-up recommendations:   Activity as tolerated. Diet as recommended by PCP. Keep all scheduled follow-up appointments as recommended.  Patient is instructed to take all prescribed medications as recommended. Report any side effects or adverse reactions to your outpatient psychiatrist. Patient is instructed to abstain from alcohol and illegal drugs while on prescription medications. In the event of worsening symptoms, patient is instructed to call the crisis hotline, 911, or go to the nearest emergency department for evaluation and treatment.  Prescriptions given at discharge. Patient agreeable to plan. Given opportunity to ask questions. Appears to feel comfortable with discharge.  Patient is also instructed prior to discharge to: Take all medications as prescribed by mental healthcare provider. Report any adverse effects and or reactions from the medicines to  outpatient provider promptly. Patient has been instructed & cautioned: To not engage in alcohol and or illegal drug use while on prescription medicines. In the event of worsening symptoms,  patient is instructed to call the crisis hotline, 911 and or go to the nearest ED for appropriate evaluation and treatment of symptoms. To follow-up with primary care provider for other medical issues, concerns and or health care needs  The patient was evaluated each day by a clinical provider to ascertain response to treatment. Improvement was noted by the patient's report of decreasing symptoms, improved sleep and appetite, affect, medication tolerance, behavior, and participation in unit programming.  Patient was asked each day to complete a self inventory noting mood, mental status, pain, new symptoms, anxiety and concerns.  Patient responded well to medication and being in a therapeutic and supportive environment. Positive and appropriate behavior was noted and the patient was motivated for recovery. The patient worked closely with the treatment team and case manager to develop a discharge plan with appropriate goals. Coping skills, problem solving as well as relaxation therapies were also part of the unit programming.  By the day of discharge patient was in much improved condition than upon admission.  Symptoms were reported as significantly decreased or resolved completely. The patient was motivated to continue taking medication with a goal of continued improvement in mental health.    Comments:   as above  Signed: Carlyn Reichert, MD 05/26/2022, 10:53 AM

## 2022-05-26 NOTE — Progress Notes (Signed)
Patient is discharging at this time. Patient is A&Ox4. Stable condition. Patient denies SI,HI, and A/V/H with no plan/intent. Printed AVS reviewed with and given to patient along with medications and follow up appointments. Patient verbalized all understanding. All valuables/belongings returned to patient. Patient is being transported by safe transport. Patient denies any pain/discomfort. No s/s of current distress.

## 2022-05-26 NOTE — Group Note (Signed)
Date:  05/26/2022 Time:  9:53 AM  Group Topic/Focus:  Goals Group:   The focus of this group is to help patients establish daily goals to achieve during treatment and discuss how the patient can incorporate goal setting into their daily lives to aide in recovery. Orientation:   The focus of this group is to educate the patient on the purpose and policies of crisis stabilization and provide a format to answer questions about their admission.  The group details unit policies and expectations of patients while admitted.    Participation Level:  Active  Participation Quality:  Appropriate  Affect:  Appropriate  Cognitive:  Appropriate  Insight: Appropriate  Engagement in Group:  Engaged  Modes of Intervention:  Discussion  Additional Comments:  Patient attended goals group and was attentive the duration of it. Patient's goal was to attend all groups.   Savion Washam T Lorraine Lax 05/26/2022, 9:53 AM

## 2022-12-07 ENCOUNTER — Other Ambulatory Visit: Payer: Self-pay | Admitting: Registered Nurse

## 2022-12-07 DIAGNOSIS — Z780 Asymptomatic menopausal state: Secondary | ICD-10-CM

## 2022-12-08 ENCOUNTER — Inpatient Hospital Stay
Admission: RE | Admit: 2022-12-08 | Discharge: 2022-12-08 | Discharge status: Home / Self Care | Payer: Medicaid Other | Source: Ambulatory Visit | Attending: Registered Nurse | Admitting: Registered Nurse

## 2022-12-08 ENCOUNTER — Encounter: Payer: Self-pay | Admitting: Gastroenterology

## 2022-12-08 DIAGNOSIS — Z780 Asymptomatic menopausal state: Secondary | ICD-10-CM

## 2023-01-06 ENCOUNTER — Ambulatory Visit (AMBULATORY_SURGERY_CENTER): Payer: Medicaid Other

## 2023-01-06 VITALS — Ht 64.0 in | Wt 160.0 lb

## 2023-01-06 DIAGNOSIS — Z1211 Encounter for screening for malignant neoplasm of colon: Secondary | ICD-10-CM

## 2023-01-06 MED ORDER — PLENVU 140 G PO SOLR: 1.0000 | Freq: Once | ORAL | 0 refills | Status: AC

## 2023-01-06 NOTE — Progress Notes (Signed)
No egg or soy allergy known to patient  No issues known to pt with past sedation with any surgeries or procedures Patient denies ever being told they had issues or difficulty with intubation  No FH of Malignant Hyperthermia Pt is not on diet pills Pt is not on  home 02  Pt is not on blood thinners  Pt denies issues with chronic constipation  No A fib or A flutter Patient's chart reviewed by Cathlyn Parsons CNRA prior to previsit and patient appropriate for the [facility].  Previsit completed and red dot placed by patient's name on their procedure day (on provider's schedule).   Have any cardiac testing pending--no Pt instructed to use [employer] or GoodRx for a price reduction on prep  Ambulates independently

## 2023-01-19 ENCOUNTER — Encounter: Payer: Medicaid Other | Admitting: Gastroenterology

## 2023-02-10 ENCOUNTER — Encounter: Payer: Medicaid Other | Admitting: Gastroenterology

## 2023-03-31 ENCOUNTER — Encounter: Payer: Medicaid Other | Admitting: Gastroenterology

## 2023-03-31 ENCOUNTER — Telehealth: Payer: Self-pay | Admitting: Gastroenterology

## 2023-03-31 DIAGNOSIS — Z1211 Encounter for screening for malignant neoplasm of colon: Secondary | ICD-10-CM

## 2023-03-31 MED ORDER — SUTAB 1479-225-188 MG PO TABS
24.0000 | ORAL_TABLET | Freq: Once | ORAL | 0 refills | Status: AC
Start: 2023-03-31 — End: 2023-03-31

## 2023-03-31 MED ORDER — ONDANSETRON 4 MG PO TBDP
4.0000 mg | ORAL_TABLET | Freq: Three times a day (TID) | ORAL | 0 refills | Status: AC | PRN
Start: 2023-03-31 — End: ?

## 2023-03-31 NOTE — Telephone Encounter (Signed)
 Good Morning Dr. Lavon Paganini,   Patient called stating that she is scheduled to have a colonoscopy this morning at 9:30 but would not be able to have procedure because last night when she went to take her prep at 6:00 she could not keep any of it down and got sick.   I offered to reschedule patient, she stated she wanted to speak with nurse regarding next steps because she does not want to get sick again. Please advise.

## 2023-03-31 NOTE — Telephone Encounter (Addendum)
 Spoke to patient this am, she reports  taking part of the prep at 6pm last night and vomiting all of it at 8pm. She had only one brown BM. Per Dr.Nandigam recommendation patient rescheduled, new prep instructions mailed and prep send to pharmacy. Patient agrees to POC

## 2023-04-05 MED ORDER — SUTAB 1479-225-188 MG PO TABS
24.0000 | ORAL_TABLET | Freq: Once | ORAL | 0 refills | Status: AC
Start: 2023-04-05 — End: 2023-04-05

## 2023-04-05 NOTE — Addendum Note (Signed)
 Addended by: Inocente Salles on: 04/05/2023 08:34 AM   Modules accepted: Orders

## 2023-04-05 NOTE — Telephone Encounter (Signed)
New prep instructions printed and mailed to pt

## 2023-05-18 ENCOUNTER — Encounter: Admitting: Gastroenterology

## 2023-05-18 ENCOUNTER — Telehealth: Payer: Self-pay | Admitting: Gastroenterology

## 2023-05-18 NOTE — Telephone Encounter (Signed)
 Good Morning Dr. Nandigam  I called this patient this morning at 7:50am to see if she was coming for her procedure. Nobody answered so I left a message for her to call us  back if she was running late or if she need to reschedule.   I will No Show her.    medicaid
